# Patient Record
Sex: Female | Born: 1935 | Race: White | Hispanic: No | Smoking: Never smoker
Health system: Southern US, Community
[De-identification: ages and names within clinical notes are randomized; demographics above are authoritative.]

## PROBLEM LIST (undated history)

## (undated) DIAGNOSIS — C801 Malignant (primary) neoplasm, unspecified: Secondary | ICD-10-CM

## (undated) DIAGNOSIS — E039 Hypothyroidism, unspecified: Secondary | ICD-10-CM

## (undated) DIAGNOSIS — I639 Cerebral infarction, unspecified: Secondary | ICD-10-CM

## (undated) DIAGNOSIS — E7800 Pure hypercholesterolemia, unspecified: Secondary | ICD-10-CM

## (undated) DIAGNOSIS — I10 Essential (primary) hypertension: Secondary | ICD-10-CM

## (undated) DIAGNOSIS — K219 Gastro-esophageal reflux disease without esophagitis: Secondary | ICD-10-CM

## (undated) HISTORY — PX: BRAIN SURGERY: SHX531

## (undated) HISTORY — PX: ABDOMINAL HYSTERECTOMY: SHX81

---

## 2006-08-10 ENCOUNTER — Inpatient Hospital Stay: Payer: Self-pay | Admitting: Internal Medicine

## 2006-08-10 ENCOUNTER — Other Ambulatory Visit: Payer: Self-pay

## 2008-07-02 ENCOUNTER — Emergency Department: Payer: Self-pay | Admitting: Emergency Medicine

## 2008-10-26 ENCOUNTER — Emergency Department: Payer: Self-pay | Admitting: Emergency Medicine

## 2011-11-06 ENCOUNTER — Emergency Department: Payer: Self-pay

## 2013-07-18 ENCOUNTER — Emergency Department (HOSPITAL_COMMUNITY)
Admission: EM | Admit: 2013-07-18 | Discharge: 2013-07-18 | Disposition: A | Discharge status: Home / Self Care | Payer: Medicare Other | Attending: Emergency Medicine | Admitting: Emergency Medicine

## 2013-07-18 ENCOUNTER — Emergency Department (HOSPITAL_COMMUNITY): Payer: Medicare Other

## 2013-07-18 ENCOUNTER — Encounter (HOSPITAL_COMMUNITY): Payer: Self-pay

## 2013-07-18 DIAGNOSIS — R5381 Other malaise: Secondary | ICD-10-CM | POA: Insufficient documentation

## 2013-07-18 DIAGNOSIS — Z789 Other specified health status: Secondary | ICD-10-CM

## 2013-07-18 DIAGNOSIS — Z993 Dependence on wheelchair: Secondary | ICD-10-CM | POA: Insufficient documentation

## 2013-07-18 DIAGNOSIS — Z7409 Other reduced mobility: Secondary | ICD-10-CM

## 2013-07-18 DIAGNOSIS — R29898 Other symptoms and signs involving the musculoskeletal system: Secondary | ICD-10-CM | POA: Insufficient documentation

## 2013-07-18 DIAGNOSIS — R29810 Facial weakness: Secondary | ICD-10-CM | POA: Insufficient documentation

## 2013-07-18 DIAGNOSIS — Z86011 Personal history of benign neoplasm of the brain: Secondary | ICD-10-CM | POA: Insufficient documentation

## 2013-07-18 DIAGNOSIS — Z8673 Personal history of transient ischemic attack (TIA), and cerebral infarction without residual deficits: Secondary | ICD-10-CM | POA: Insufficient documentation

## 2013-07-18 HISTORY — DX: Cerebral infarction, unspecified: I63.9

## 2013-07-18 LAB — CBC WITH DIFFERENTIAL/PLATELET
Basophils Absolute: 0 10*3/uL (ref 0.0–0.1)
Basophils Relative: 1 % (ref 0–1)
Eosinophils Absolute: 0.1 10*3/uL (ref 0.0–0.7)
Eosinophils Relative: 1 % (ref 0–5)
HCT: 45.8 % (ref 36.0–46.0)
Hemoglobin: 15.9 g/dL — ABNORMAL HIGH (ref 12.0–15.0)
Lymphocytes Relative: 22 % (ref 12–46)
Lymphs Abs: 1.3 10*3/uL (ref 0.7–4.0)
MCH: 33.6 pg (ref 26.0–34.0)
MCHC: 34.7 g/dL (ref 30.0–36.0)
MCV: 96.8 fL (ref 78.0–100.0)
Monocytes Absolute: 0.6 10*3/uL (ref 0.1–1.0)
Monocytes Relative: 10 % (ref 3–12)
Neutro Abs: 4 10*3/uL (ref 1.7–7.7)
Neutrophils Relative %: 68 % (ref 43–77)
Platelets: 154 10*3/uL (ref 150–400)
RBC: 4.73 MIL/uL (ref 3.87–5.11)
RDW: 14.4 % (ref 11.5–15.5)
WBC: 5.9 10*3/uL (ref 4.0–10.5)

## 2013-07-18 LAB — URINALYSIS, ROUTINE W REFLEX MICROSCOPIC
Bilirubin Urine: NEGATIVE
Glucose, UA: NEGATIVE mg/dL
Ketones, ur: NEGATIVE mg/dL
Leukocytes, UA: NEGATIVE
Nitrite: NEGATIVE
Protein, ur: NEGATIVE mg/dL
Specific Gravity, Urine: 1.01 (ref 1.005–1.030)
Urobilinogen, UA: 0.2 mg/dL (ref 0.0–1.0)
pH: 7 (ref 5.0–8.0)

## 2013-07-18 LAB — COMPREHENSIVE METABOLIC PANEL
ALT: 16 U/L (ref 0–35)
AST: 25 U/L (ref 0–37)
Albumin: 3.9 g/dL (ref 3.5–5.2)
Alkaline Phosphatase: 94 U/L (ref 39–117)
BUN: 12 mg/dL (ref 6–23)
CO2: 31 mEq/L (ref 19–32)
Calcium: 9.6 mg/dL (ref 8.4–10.5)
Chloride: 103 mEq/L (ref 96–112)
Creatinine, Ser: 0.91 mg/dL (ref 0.50–1.10)
GFR calc Af Amer: 66 mL/min — ABNORMAL LOW (ref >90)
GFR calc non Af Amer: 57 mL/min — ABNORMAL LOW (ref >90)
Glucose, Bld: 108 mg/dL — ABNORMAL HIGH (ref 70–99)
Potassium: 3.4 mEq/L — ABNORMAL LOW (ref 3.5–5.1)
Sodium: 144 mEq/L (ref 135–145)
Total Bilirubin: 0.9 mg/dL (ref 0.3–1.2)
Total Protein: 7.3 g/dL (ref 6.0–8.3)

## 2013-07-18 LAB — URINE MICROSCOPIC-ADD ON

## 2013-07-18 LAB — TROPONIN I: Troponin I: <0.3 ng/mL (ref <0.30)

## 2013-07-18 LAB — MAGNESIUM: Magnesium: 2.2 mg/dL (ref 1.5–2.5)

## 2013-07-18 NOTE — ED Notes (Signed)
[facility] stated they will take the pt home and fu with Evlyn Kanner. Social Services [phone] for home health.

## 2013-07-18 NOTE — ED Notes (Signed)
Pt brought here from [place] area because they want to get check out. Also, want help with either placement or help in their home

## 2013-07-18 NOTE — Progress Notes (Signed)
Spoke with pt and spouse prior to MD seeing. They have come to ED due to feeling unable to continue at home without being " checked out" because they are not eating or drinking well and  Think they may be dehydrated, and run down. They are just not doing well trying to care for each other, but do not really want to go into a home, if we at the hospital can "do something for them". Will refer to CSW if  Needed after MD sees pt

## 2013-07-18 NOTE — ED Provider Notes (Signed)
CSN: [id number]     Arrival date & time 07/18/13  1504 History  This chart was scribed for Raeford Razor, MD by Bennett Scrape, ED Scribe. This patient was seen in room APA03/APA03 and the patient's care was started at 4:13 PM.   Chief Complaint  Patient presents with  . multiple complaints     The history is provided by the patient. No language interpreter was used.    HPI Comments: Faith Donovan is a 79 y.o. female who presents to the Emergency Department with husband requesting placement. Husband is the main caretaker of the pt. He states that he can't take of himself, doesn't want to eat and just feels "run down". He states that he doesn't have any energy to ambulate around his house. He states that he does all the cooking and yard work for the pt but just can't do it anymore. Pt states that he has one son that feels that both the pt and her husband should find placement as well. Husband states that she has been seen at [facility] and [facility] with the same complaints and received no help. He reports that he was having a nurse come daily more than 2 years ago but told her to not "come back" due to her saying he was wasting money. Husband denies having any other resources or in home checks since then. Pt has a h/o CVA and brain tumor. 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 Pt states that she uses a wheelchair but can ambulate with a walker.   PCP is Dr. Andres Shad with the [facility] Pt states that he lives at home alone with his wife.  Past Medical History  Diagnosis Date  . Stroke    Past Surgical History  Procedure Laterality Date  . Brain surgery     No family history on file. History  Substance Use Topics  . Smoking status: Not on file  . Smokeless tobacco: Not on file  . Alcohol Use: No   No OB history provided.  Review of Systems  Respiratory: Negative for  shortness of breath.   Cardiovascular: Negative for chest pain.  Gastrointestinal: Negative for abdominal pain.  Neurological: Positive for weakness (chronic right-sided weakness but worsening).  All other systems reviewed and are negative.    Allergies  Review of patient's allergies indicates not on file.  Home Medications  No current outpatient prescriptions on file.  Triage Vitals: BP 141/60  Pulse 70  Temp(Src) 98.3 F (36.8 C) (Oral)  Resp 20  SpO2 94%  Physical Exam  Nursing note and vitals reviewed. Constitutional: She is oriented to person, place, and time. No distress.  frail and chronically ill appearing, but not distressed  HENT:  Head: Normocephalic and atraumatic.  Eyes: EOM are normal.  Adhesions to the right eyelid   Neck: Neck supple. No tracheal deviation present.  Cardiovascular: Normal rate and regular rhythm.   Pulmonary/Chest: Effort normal and breath sounds normal. No respiratory distress.  Abdominal: Soft. There is no tenderness.  Musculoskeletal: Normal range of motion.  Neurological: She is alert and oriented to person, place, and time.  Right sided weakness, contracture of RUE, right-sided facial droop   Skin: Skin is warm and dry.  Psychiatric: She has a normal mood and affect. Her behavior is normal.    ED Course   DIAGNOSTIC STUDIES: Oxygen Saturation is 94% on room air, adequate by  my interpretation.    COORDINATION OF CARE: 4:32 PM-Informed pt that placement may not be achieved today. Discussed treatment plan which includes CBC panel, CMP and UA with pt at bedside and pt agreed to plan. Will have social work speak with the pt.  Procedures (including critical care time)  Labs Reviewed  URINALYSIS, ROUTINE W REFLEX MICROSCOPIC - Abnormal; Notable for the following:    Hgb urine dipstick MODERATE (*)    All other components within normal limits  CBC WITH DIFFERENTIAL - Abnormal; Notable for the following:    Hemoglobin 15.9 (*)     All other components within normal limits  COMPREHENSIVE METABOLIC PANEL - Abnormal; Notable for the following:    Potassium 3.4 (*)    Glucose, Bld 108 (*)    GFR calc non Af Amer 57 (*)    GFR calc Af Amer 66 (*)    All other components within normal limits  MAGNESIUM  TROPONIN I  URINE MICROSCOPIC-ADD ON   Ct Head Wo Contrast  07/18/2013   *RADIOLOGY REPORT*  Clinical Data: Worsening right-sided weakness in patient with prior surgery for removal of unspecified brain tumor.  CT HEAD WITHOUT CONTRAST  Technique:  Contiguous axial images were obtained from the base of the skull through the vertex without contrast.  Comparison: None.  Findings: Prior right temporal-occipital craniectomy with resection of part of the right cerebellum.  Old stroke involving the left basal ganglia with encephalomalacia accounting for enlargement of the frontal horn of the left lateral ventricle.  Moderate cortical atrophy.  Moderate to severe changes of small vessel disease of the white matter diffusely.  No mass lesion.  No midline shift.  No acute hemorrhage or hematoma.  No extra-axial fluid collections. No evidence of acute infarction.  Visualized paranasal sinuses, bilateral mastoid air cells, and left middle ear cavities well-aerated. Prior right mastoidectomy.  IMPRESSION:  1.  No acute intracranial abnormality. 2.  Prior right temporal-occipital craniectomy with resection of a large portion of the right cerebellar hemisphere. 3.  Old stroke involving the left basal ganglia with encephalomalacia.   Original Report Authenticated By: Hulan Saas, M.D.   No results found. 1. Impaired mobility and ADLs     MDM  85yF with generalized fatigue. Pt and husband who also presented are in need of additional assistance. They have a son but it sounds like he has parkinson's and unfortunately unable to provide much assistance. W/u today fairly unremarkable.  Pt afebrile and nontoxic. Pt and husband have no ride home. May  potentially be able to have neighbor provide them a ride. Care management spoke with pt/wife and APS to be involved as well as home nursing assessment arranged.    I personally preformed the services scribed in my presence. The recorded information has been reviewed is accurate. Raeford Razor, MD.    Raeford Razor, MD 07/24/13 [phone]

## 2013-07-18 NOTE — ED Notes (Signed)
Pt very poor historian

## 2013-07-18 NOTE — ED Notes (Signed)
Discharge instructions reviewed with pt, questions answered. Pt verbalized understanding.  

## 2013-11-26 ENCOUNTER — Inpatient Hospital Stay: Payer: Self-pay | Admitting: Internal Medicine

## 2013-11-26 LAB — COMPREHENSIVE METABOLIC PANEL
Albumin: 3.2 g/dL — ABNORMAL LOW (ref 3.4–5.0)
Anion Gap: 9 (ref 7–16)
Bilirubin,Total: 0.9 mg/dL (ref 0.2–1.0)
Calcium, Total: 8.9 mg/dL (ref 8.5–10.1)
Co2: 26 mmol/L (ref 21–32)
Creatinine: 1.13 mg/dL (ref 0.60–1.30)
EGFR (African American): 52 — ABNORMAL LOW
EGFR (Non-African Amer.): 45 — ABNORMAL LOW
Glucose: 164 mg/dL — ABNORMAL HIGH (ref 65–99)
Osmolality: 281 (ref 275–301)
Potassium: 3.8 mmol/L (ref 3.5–5.1)
SGOT(AST): 38 U/L — ABNORMAL HIGH (ref 15–37)
Total Protein: 6.7 g/dL (ref 6.4–8.2)

## 2013-11-26 LAB — CBC WITH DIFFERENTIAL/PLATELET
Basophil %: 1.2 %
Eosinophil #: 0 10*3/uL (ref 0.0–0.7)
HGB: 14.2 g/dL (ref 12.0–16.0)
Lymphocyte %: 10.4 %
MCH: 33.3 pg (ref 26.0–34.0)
Monocyte %: 10.1 %
Neutrophil #: 5.3 10*3/uL (ref 1.4–6.5)
Neutrophil %: 77.6 %
RDW: 15 % — ABNORMAL HIGH (ref 11.5–14.5)
WBC: 6.8 10*3/uL (ref 3.6–11.0)

## 2013-11-26 LAB — URINALYSIS, COMPLETE
Bilirubin,UR: NEGATIVE
Glucose,UR: NEGATIVE mg/dL (ref 0–75)
Hyaline Cast: 7
Leukocyte Esterase: NEGATIVE
Nitrite: NEGATIVE
Ph: 5 (ref 4.5–8.0)
RBC,UR: 5 /HPF (ref 0–5)

## 2013-11-26 LAB — TROPONIN I
Troponin-I: <0.02 ng/mL
Troponin-I: <0.02 ng/mL

## 2013-11-26 LAB — PRO B NATRIURETIC PEPTIDE: B-Type Natriuretic Peptide: 6169 pg/mL — ABNORMAL HIGH (ref 0–450)

## 2013-11-27 LAB — CBC WITH DIFFERENTIAL/PLATELET
BASOS ABS: 0.1 10*3/uL (ref 0.0–0.1)
Basophil %: 1.1 %
EOS ABS: 0.1 10*3/uL (ref 0.0–0.7)
Eosinophil %: 1.3 %
HCT: 40.1 % (ref 35.0–47.0)
HGB: 13.6 g/dL (ref 12.0–16.0)
LYMPHS ABS: 0.8 10*3/uL — AB (ref 1.0–3.6)
LYMPHS PCT: 15.4 %
MCH: 33.8 pg (ref 26.0–34.0)
MCHC: 33.9 g/dL (ref 32.0–36.0)
MCV: 100 fL (ref 80–100)
MONO ABS: 0.4 x10 3/mm (ref 0.2–0.9)
Monocyte %: 8.7 %
Neutrophil #: 3.8 10*3/uL (ref 1.4–6.5)
Neutrophil %: 73.5 %
Platelet: 173 10*3/uL (ref 150–440)
RBC: 4.02 10*6/uL (ref 3.80–5.20)
RDW: 15.2 % — AB (ref 11.5–14.5)
WBC: 5.2 10*3/uL (ref 3.6–11.0)

## 2013-11-27 LAB — BASIC METABOLIC PANEL
ANION GAP: 2 — AB (ref 7–16)
BUN: 11 mg/dL (ref 7–18)
CALCIUM: 8.7 mg/dL (ref 8.5–10.1)
CHLORIDE: 102 mmol/L (ref 98–107)
CO2: 35 mmol/L — AB (ref 21–32)
CREATININE: 1.07 mg/dL (ref 0.60–1.30)
EGFR (Non-African Amer.): 48 — ABNORMAL LOW
GFR CALC AF AMER: 56 — AB
Glucose: 132 mg/dL — ABNORMAL HIGH (ref 65–99)
Osmolality: 279 (ref 275–301)
Potassium: 3.1 mmol/L — ABNORMAL LOW (ref 3.5–5.1)
Sodium: 139 mmol/L (ref 136–145)

## 2013-11-28 LAB — BASIC METABOLIC PANEL
ANION GAP: 5 — AB (ref 7–16)
BUN: 13 mg/dL (ref 7–18)
CHLORIDE: 103 mmol/L (ref 98–107)
Calcium, Total: 8.4 mg/dL — ABNORMAL LOW (ref 8.5–10.1)
Co2: 34 mmol/L — ABNORMAL HIGH (ref 21–32)
Creatinine: 1.03 mg/dL (ref 0.60–1.30)
EGFR (Non-African Amer.): 50 — ABNORMAL LOW
GFR CALC AF AMER: 58 — AB
GLUCOSE: 75 mg/dL (ref 65–99)
OSMOLALITY: 282 (ref 275–301)
POTASSIUM: 3.2 mmol/L — AB (ref 3.5–5.1)
Sodium: 142 mmol/L (ref 136–145)

## 2013-12-17 ENCOUNTER — Ambulatory Visit: Payer: Self-pay | Admitting: Family Medicine

## 2014-01-25 ENCOUNTER — Ambulatory Visit: Payer: Self-pay | Admitting: Internal Medicine

## 2014-01-27 LAB — CBC
HCT: 43.9 % (ref 35.0–47.0)
HGB: 14.3 g/dL (ref 12.0–16.0)
MCH: 32.5 pg (ref 26.0–34.0)
MCHC: 32.6 g/dL (ref 32.0–36.0)
MCV: 100 fL (ref 80–100)
PLATELETS: 162 10*3/uL (ref 150–440)
RBC: 4.4 10*6/uL (ref 3.80–5.20)
RDW: 17.7 % — ABNORMAL HIGH (ref 11.5–14.5)
WBC: 5.6 10*3/uL (ref 3.6–11.0)

## 2014-01-27 LAB — COMPREHENSIVE METABOLIC PANEL
ALBUMIN: 3.4 g/dL (ref 3.4–5.0)
ALK PHOS: 127 U/L — AB
ALT: 21 U/L (ref 12–78)
Anion Gap: 8 (ref 7–16)
BILIRUBIN TOTAL: 1.2 mg/dL — AB (ref 0.2–1.0)
BUN: 20 mg/dL — AB (ref 7–18)
CHLORIDE: 99 mmol/L (ref 98–107)
Calcium, Total: 9.2 mg/dL (ref 8.5–10.1)
Co2: 32 mmol/L (ref 21–32)
Creatinine: 1.35 mg/dL — ABNORMAL HIGH (ref 0.60–1.30)
GFR CALC AF AMER: 42 — AB
GFR CALC NON AF AMER: 36 — AB
Glucose: 97 mg/dL (ref 65–99)
OSMOLALITY: 280 (ref 275–301)
Potassium: 3.5 mmol/L (ref 3.5–5.1)
SGOT(AST): 37 U/L (ref 15–37)
SODIUM: 139 mmol/L (ref 136–145)
Total Protein: 7.3 g/dL (ref 6.4–8.2)

## 2014-01-27 LAB — PRO B NATRIURETIC PEPTIDE: B-TYPE NATIURETIC PEPTID: 8220 pg/mL — AB (ref 0–450)

## 2014-01-27 LAB — TROPONIN I

## 2014-01-28 ENCOUNTER — Inpatient Hospital Stay: Payer: Self-pay | Admitting: Family Medicine

## 2014-01-28 LAB — COMPREHENSIVE METABOLIC PANEL
ALBUMIN: 2.7 g/dL — AB (ref 3.4–5.0)
ALK PHOS: 99 U/L
ANION GAP: 6 — AB (ref 7–16)
BILIRUBIN TOTAL: 1 mg/dL (ref 0.2–1.0)
BUN: 19 mg/dL — AB (ref 7–18)
Calcium, Total: 8.3 mg/dL — ABNORMAL LOW (ref 8.5–10.1)
Chloride: 100 mmol/L (ref 98–107)
Co2: 34 mmol/L — ABNORMAL HIGH (ref 21–32)
Creatinine: 1.21 mg/dL (ref 0.60–1.30)
EGFR (African American): 48 — ABNORMAL LOW
EGFR (Non-African Amer.): 41 — ABNORMAL LOW
GLUCOSE: 66 mg/dL (ref 65–99)
OSMOLALITY: 280 (ref 275–301)
Potassium: 3 mmol/L — ABNORMAL LOW (ref 3.5–5.1)
SGOT(AST): 28 U/L (ref 15–37)
SGPT (ALT): 12 U/L (ref 12–78)
SODIUM: 140 mmol/L (ref 136–145)
Total Protein: 5.7 g/dL — ABNORMAL LOW (ref 6.4–8.2)

## 2014-01-28 LAB — CBC WITH DIFFERENTIAL/PLATELET
Basophil #: 0 10*3/uL (ref 0.0–0.1)
Basophil %: 1.4 %
Eosinophil #: 0 10*3/uL (ref 0.0–0.7)
Eosinophil %: 1.5 %
HCT: 37.4 % (ref 35.0–47.0)
HGB: 12.7 g/dL (ref 12.0–16.0)
LYMPHS ABS: 0.8 10*3/uL — AB (ref 1.0–3.6)
LYMPHS PCT: 24.2 %
MCH: 33.9 pg (ref 26.0–34.0)
MCHC: 33.9 g/dL (ref 32.0–36.0)
MCV: 100 fL (ref 80–100)
MONO ABS: 0.4 x10 3/mm (ref 0.2–0.9)
MONOS PCT: 12.9 %
Neutrophil #: 2 10*3/uL (ref 1.4–6.5)
Neutrophil %: 60 %
Platelet: 116 10*3/uL — ABNORMAL LOW (ref 150–440)
RBC: 3.73 10*6/uL — AB (ref 3.80–5.20)
RDW: 17.6 % — AB (ref 11.5–14.5)
WBC: 3.3 10*3/uL — ABNORMAL LOW (ref 3.6–11.0)

## 2014-01-28 LAB — PRO B NATRIURETIC PEPTIDE: B-Type Natriuretic Peptide: 8814 pg/mL — ABNORMAL HIGH (ref 0–450)

## 2014-01-29 LAB — BASIC METABOLIC PANEL
ANION GAP: 5 — AB (ref 7–16)
BUN: 21 mg/dL — ABNORMAL HIGH (ref 7–18)
CALCIUM: 8.5 mg/dL (ref 8.5–10.1)
CO2: 34 mmol/L — AB (ref 21–32)
CREATININE: 1.34 mg/dL — AB (ref 0.60–1.30)
Chloride: 103 mmol/L (ref 98–107)
EGFR (African American): 42 — ABNORMAL LOW
EGFR (Non-African Amer.): 37 — ABNORMAL LOW
GLUCOSE: 88 mg/dL (ref 65–99)
OSMOLALITY: 286 (ref 275–301)
Potassium: 3.4 mmol/L — ABNORMAL LOW (ref 3.5–5.1)
Sodium: 142 mmol/L (ref 136–145)

## 2014-01-29 LAB — PROTIME-INR
INR: 1.4
Prothrombin Time: 16.8 secs — ABNORMAL HIGH (ref 11.5–14.7)

## 2014-01-29 LAB — PROTEIN, BODY FLUID: PROTEIN, BODY FLUID: 2 g/dL

## 2014-01-29 LAB — LACTATE DEHYDROGENASE, PLEURAL OR PERITONEAL FLUID: LDH, Body Fluid: 74 U/L

## 2014-01-29 LAB — GLUCOSE, SEROUS FLUID: GLUCOSE, BODY FLUID: 98 mg/dL

## 2014-02-25 ENCOUNTER — Ambulatory Visit: Payer: Self-pay | Admitting: Internal Medicine

## 2014-03-09 LAB — COMPREHENSIVE METABOLIC PANEL
ALBUMIN: 3.2 g/dL — AB (ref 3.4–5.0)
ALK PHOS: 133 U/L — AB
ANION GAP: 8 (ref 7–16)
AST: 26 U/L (ref 15–37)
BILIRUBIN TOTAL: 1.3 mg/dL — AB (ref 0.2–1.0)
BUN: 16 mg/dL (ref 7–18)
Calcium, Total: 9.4 mg/dL (ref 8.5–10.1)
Chloride: 98 mmol/L (ref 98–107)
Co2: 33 mmol/L — ABNORMAL HIGH (ref 21–32)
Creatinine: 1 mg/dL (ref 0.60–1.30)
EGFR (Non-African Amer.): 52 — ABNORMAL LOW
Glucose: 84 mg/dL (ref 65–99)
Osmolality: 278 (ref 275–301)
Potassium: 3.7 mmol/L (ref 3.5–5.1)
SGPT (ALT): 22 U/L (ref 12–78)
SODIUM: 139 mmol/L (ref 136–145)
Total Protein: 7.4 g/dL (ref 6.4–8.2)

## 2014-03-09 LAB — URINALYSIS, COMPLETE
BILIRUBIN, UR: NEGATIVE
Glucose,UR: NEGATIVE mg/dL (ref 0–75)
LEUKOCYTE ESTERASE: NEGATIVE
Nitrite: NEGATIVE
Ph: 6 (ref 4.5–8.0)
Protein: 30
SPECIFIC GRAVITY: 1.015 (ref 1.003–1.030)

## 2014-03-09 LAB — TROPONIN I: Troponin-I: <0.02 ng/mL

## 2014-03-09 LAB — CBC
HCT: 43.4 % (ref 35.0–47.0)
HGB: 14.7 g/dL (ref 12.0–16.0)
MCH: 33.7 pg (ref 26.0–34.0)
MCHC: 33.9 g/dL (ref 32.0–36.0)
MCV: 100 fL (ref 80–100)
PLATELETS: 196 10*3/uL (ref 150–440)
RBC: 4.37 10*6/uL (ref 3.80–5.20)
RDW: 16.5 % — ABNORMAL HIGH (ref 11.5–14.5)
WBC: 6.2 10*3/uL (ref 3.6–11.0)

## 2014-03-10 ENCOUNTER — Observation Stay: Payer: Self-pay | Admitting: Internal Medicine

## 2014-03-10 LAB — CBC WITH DIFFERENTIAL/PLATELET
BASOS PCT: 0.9 %
Basophil #: 0.1 10*3/uL (ref 0.0–0.1)
EOS ABS: 0.1 10*3/uL (ref 0.0–0.7)
Eosinophil %: 1.6 %
HCT: 39.5 % (ref 35.0–47.0)
HGB: 12.8 g/dL (ref 12.0–16.0)
LYMPHS ABS: 0.6 10*3/uL — AB (ref 1.0–3.6)
LYMPHS PCT: 10.5 %
MCH: 32.6 pg (ref 26.0–34.0)
MCHC: 32.5 g/dL (ref 32.0–36.0)
MCV: 101 fL — ABNORMAL HIGH (ref 80–100)
MONO ABS: 0.5 x10 3/mm (ref 0.2–0.9)
Monocyte %: 7.9 %
NEUTROS ABS: 4.6 10*3/uL (ref 1.4–6.5)
Neutrophil %: 79.1 %
Platelet: 163 10*3/uL (ref 150–440)
RBC: 3.93 10*6/uL (ref 3.80–5.20)
RDW: 16.5 % — ABNORMAL HIGH (ref 11.5–14.5)
WBC: 5.8 10*3/uL (ref 3.6–11.0)

## 2014-03-10 LAB — BASIC METABOLIC PANEL
ANION GAP: 8 (ref 7–16)
BUN: 12 mg/dL (ref 7–18)
CHLORIDE: 102 mmol/L (ref 98–107)
CREATININE: 0.9 mg/dL (ref 0.60–1.30)
Calcium, Total: 8.9 mg/dL (ref 8.5–10.1)
Co2: 32 mmol/L (ref 21–32)
EGFR (African American): >60
EGFR (Non-African Amer.): 59 — ABNORMAL LOW
GLUCOSE: 61 mg/dL — AB (ref 65–99)
Osmolality: 281 (ref 275–301)
POTASSIUM: 3.8 mmol/L (ref 3.5–5.1)
SODIUM: 142 mmol/L (ref 136–145)

## 2014-03-11 LAB — HEPATIC FUNCTION PANEL A (ARMC)
Albumin: 2.9 g/dL — ABNORMAL LOW (ref 3.4–5.0)
Alkaline Phosphatase: 119 U/L — ABNORMAL HIGH
Bilirubin, Direct: 0.3 mg/dL — ABNORMAL HIGH (ref 0.00–0.20)
Bilirubin,Total: 1.1 mg/dL — ABNORMAL HIGH (ref 0.2–1.0)
SGOT(AST): 35 U/L (ref 15–37)
SGPT (ALT): 19 U/L (ref 12–78)
Total Protein: 6.6 g/dL (ref 6.4–8.2)

## 2014-03-27 ENCOUNTER — Ambulatory Visit: Payer: Self-pay | Admitting: Internal Medicine

## 2015-01-06 ENCOUNTER — Emergency Department: Payer: Self-pay | Admitting: Emergency Medicine

## 2015-03-20 NOTE — Consult Note (Signed)
PATIENT NAME:  Faith Donovan, Faith Donovan MR#:  [id number] DATE OF BIRTH:  03/28/1928  DATE OF CONSULTATION:  01/30/2014  CONSULTING PHYSICIAN:  Joaquina Nissen E. Genevive Bi, MD  REQUESTING PHYSICIAN:  [place] Sink, MD  REASON FOR CONSULT: Management of right pleural effusion.  I have personally seen and examined Faith Donovan. I have discussed her care with Ms. Ebbie Ridge from the hospice center.   REPORT OF CONSULTATION:  Faith Donovan is an 81 year old woman who suffered a stroke many years ago, and is able to walk with great difficulty using a walker. She has noticed over the last several days increasing swelling in her right upper extremity. She presented to the Emergency Room, where she was found to have a significant right-sided pleural effusion, which underwent a thoracentesis. Post-thoracentesis, her chest x-ray was markedly improved. However, over the course of just 24 hours, the pleural effusion has recurred. She does have a history of diastolic heart failure, with an ejection fraction of 60% to 65%, but when she came into the hospital this admission, her BNP was markedly elevated and her chest x-ray showed bilateral pleural effusions consistent with heart failure.   On physical examination, she is inspiring nasal cannula oxygen. She does not appear to be in any distress. She does not complain of any shortness of breath. She has an irregularly irregular heart rate. She has marked jugular venous distention on the right and some on the left. There is marked swelling of her right upper extremity. There is not a lot of swelling in her lower extremities.   I believe that there is some vascular obstruction on the right side, which is causing her right upper extremity swelling, and perhaps even in the right neck. It seems somewhat unusual to have the lower extremity and the left upper extremity spared of any swelling if this were just due to heart failure alone. She does have diminished breath  sounds on the right side, but the left side is actually reasonably clear.   I have discussed her care with Ms. Ebbie Ridge from the hospice center. I have also read Dr. Marcene Corning notes. There does not appear to be a great desire for extensive investigation or therapy. The patient does not want any surgical interventions. I specifically asked her this, and she does not want anything further to be done from a surgery standpoint.   At the present time, I have little to offer her. It does appear that her pleural effusions are related to heart failure as the most likely cause. I would recommend, if anything further is contemplated, that an additional thoracentesis be performed. At that time, I would leave a pigtail catheter in place so that we could entertain the possibility of performing a doxycycline pleurodesis. However, if the patient does not wish anything further to be done, I believe that hospice would be a reasonable choice for her.   Thank you very much for allowing me to participate in her care.    ____________________________ Lew Dawes. Genevive Bi, MD teo:mr D: 01/30/2014 17:37:43 ET T: 01/30/2014 23:06:18 ET JOB#: [id number]  cc: Christia Reading E. Genevive Bi, MD, <Dictator> Louis Matte MD ELECTRONICALLY SIGNED 02/09/2014 8:23

## 2015-03-20 NOTE — H&P (Signed)
PATIENT NAME:  Faith Donovan, HUGHSTON MR#:  [id number] DATE OF BIRTH:  Aug 23, 1928  DATE OF ADMISSION:  11/26/2013  PRIMARY CARE PHYSICIAN: Dr. Delight Stare.    REFERRING EMERGENCY ROOM PHYSICIAN: Dr. Lavonia Drafts, M.D.   CHIEF COMPLAINT: Leg swelling and shortness of breath.  HISTORY OF PRESENT ILLNESS: An 81 year old female who has a history of hypertension, hypercholesterolemia, old stroke, some leftover weakness on right side of her body, but living independent life with her husband walking with a walker. For the last few days, she noticed that her legs are swollen more and she is feeling short of breath especially while walking or while lying down and so decided to come to the Emergency Room for this problem. On workup she was found having an elevated BNP and some moderate pleural effusion on the right side of the chest with edema on the legs. Normal troponin but atrial fibrillation on EKG. She never had history of EKG or did not find any previous EKGs  showing atrial fibrillation, so we are admitting her for further work-up of CHF and new onset of AFib.  On further questioning, she denies any complaint of fever, cough or chest pain or palpitation.   REVIEW OF SYSTEMS: CONSTITUTIONAL: Negative for fever, fatigue, weakness or pain or weight loss.  EYES: No blurring, double vision, discharge or redness.  EARS, NOSE, THROAT: No tinnitus, ear pain or hearing loss.  RESPIRATORY: No cough, wheezing, hemoptysis. Positive for shortness of breath.  CARDIOVASCULAR: No chest pain, orthopnea, edema, arrhythmia or palpitations.  GASTROINTESTINAL: No nausea, vomiting, diarrhea or abdominal pain.  GENITOURINARY: No dysuria, hematuria or increased frequency.  ENDOCRINE: No increased sweating. No heat or cold intolerance.  SKIN: No acne, rashes, or lesions.  MUSCULOSKELETAL: No pain or swelling in the joints but her legs are swollen.  NEUROLOGICAL: No numbness, weakness, tremor or vertigo, but chronic right-sided  weakness secondary to her stroke in 1986.  PSYCHIATRIC: No anxiety, insomnia, bipolar disorder.   PAST MEDICAL HISTORY: 1.  Stroke in 1986 with residual right-sided facial and right-sided body weakness with some chronic contractures.  2.  Hypertension.  3.  Hypothyroidism.   PAST SURGICAL HISTORY: None.   SOCIAL HISTORY: Lives with her husband, walks with a walker. She was a smoker years ago but quit when she had stroke in 1986. Denies drinking alcohol or doing any drugs.   FAMILY HISTORY: Positive for stroke in her father and cancer in her mother and some other family members.    HOME MEDICATIONS:   1.  Quinapril 10 mg 2 times a day.  2.  Nifedipine 60 mg oral once a day.  3.  Levothyroxine 50 mcg once a day.  4.  Gabapentin 100 mg once a day.  5.  Furosemide 20 mg every other day.  6.  Atenolol 50 mg 2 tablets once a day.   PHYSICAL EXAMINATION:   VITAL SIGNS:  In the ER, temperature 97.7, pulse rate ranging from 100 to 120, respirations 18, blood pressure 116/68 and pulse oximetry 88% on room air, which came up to 95% with 2 liters nasal cannula oxygen supplementation.  GENERAL: The patient is fully alert and oriented to time, place, and person, cooperative with history taking and physical examination.  HEAD AND NECK:  Atraumatic. Conjunctiva pink. Oral mucosa moist. There is some facial weakness on the right side of the face present and the right side eyelid is drooling with some secretions and dryness in right eye.    NECK: Supple. JVD  is present up to angle of mandible.  CARDIOVASCULAR: S1, S2 present, irregular murmur present.  RESPIRATORY: Bilateral air entry present, slightly decreased in the right. Some crepitation present.  ABDOMEN: Soft, nontender. Bowel sounds present. No organomegaly.  SKIN: No rashes.  LEGS: Edema present, more on right side compared to left.   JOINTS:  There are chronic atrophic changes present on right side upper limb and right lower limb.   NEUROLOGICAL: Right-sided weakness power 4/5, left side 5/5. No tremors or vertigo.  PSYCHIATRIC: Does not appear in any acute psychiatric illness at this time.   IMPORTANT LABORATORY RESULTS: BNP 6169, glucose 164, BUN 13, creatinine 1.13, sodium 139, potassium 3.8, chloride 104, CO2 26, magnesium 2.1, calcium 8.9.   Total protein 6.7, albumin 3.2, bilirubin 0.9, alkaline phosphate 110, SGOT 38 and SGPT 23. Troponin less than 0.02.   WBC 6.8, hemoglobin 14.2, platelet count 202, and MCV is 101.   Urinalysis is grossly negative.   Chest x-ray, portable, shows moderate right pleural effusion with dense basilar or consolidation. EKG shows atrial fibrillation.   ASSESSMENT AND PLAN: An 81 year old female with past medical history of cerebrovascular accident and hypertension, came to the hospital with complaint of feeling leg swelling and short of breath for a few days, found having congestive heart failure and new onset atrial fibrillation as we do not have any record of atrial fibrillation in the past. 2.  Congestive heart failure exacerbation. Most likely it is acute diastolic but we do not have any ejection fraction recording so we will do echocardiogram here. Meanwhile, we will give IV Lasix, monitor on telemetry and check serial troponin. We will also get Cardiology consult because of these new onset issues.  3.  New-onset atrial fibrillation. We will follow troponin. Will give Cardizem oral for rate control and carvedilol for blood pressure and rate management and monitor on telemetry.  4.  Hypertension. Atenolol changed to carvedilol because of atrial fibrillation.  We will give Lasix and ACE inhibitor.  5.  Old stroke. We will give aspirin and will check lipid panel.   TOTAL TIME SPENT ON THIS ADMISSION: 50 minutes.   CODE STATUS: FULL CODE    ____________________________ Ceasar Lund Anselm Jungling, MD vgv:dp D: 11/26/2013 16:15:34 ET T: 11/26/2013 17:08:56  ET JOB#: [id number]  cc: Ceasar Lund. Anselm Jungling, MD, <Dictator> Vaughan Basta MD ELECTRONICALLY SIGNED 12/03/2013 13:51

## 2015-03-20 NOTE — Consult Note (Signed)
Present Illness 81 yo female with history of right body cva which is not acute who was admitted with progressive shortness of breath while ambulating with her husband and noted to have increased peripheral edema. She was brought to the er and noted to have afib. Pt has no evidence of afib on ekgs here at [facility] but pt states she was admitted to a hospital somewhere (pt is not sure which one) and she was told her heart was "cutting up but nothing could be done".  SHe has been aware of irregular heart beat for several years. Her CHADSS score is 5.  She denies falls or head trauma. Her cva is chronic.   Physical Exam:  GEN disheveled   HEENT moist oral mucosa   NECK supple   RESP normal resp effort  clear BS   CARD Irregular rate and rhythm  Murmur   Murmur Systolic   Systolic Murmur axilla   ABD denies tenderness  normal BS   LYMPH negative neck   EXTR positive edema   SKIN normal to palpation   NEURO R side weakness   PSYCH alert   Review of Systems:  Subjective/Chief Complaint shortness of breath and irregiular heart rate   General: Fatigue   Skin: No Complaints   ENT: No Complaints   Eyes: No Complaints   Neck: No Complaints   Respiratory: Short of breath   Cardiovascular: Palpitations  Dyspnea   Gastrointestinal: No Complaints   Genitourinary: No Complaints   Vascular: No Complaints   Musculoskeletal: No Complaints   Neurologic: No Complaints   Hematologic: No Complaints   Endocrine: No Complaints   Psychiatric: No Complaints   Review of Systems: All other systems were reviewed and found to be negative   Medications/Allergies Reviewed Medications/Allergies reviewed   EKG:  Abnormal NSSTTW changes   Interpretation afib with variable venticular resposne    Other -Explain in Comment: Other   Impression 81 yo female with history of right body cva which is not acute who was admitted with progressive shortness of breath while ambulating with  her husband and noted to have increased peripheral edema. She was brought to the er and noted to have afib. Pt has no evidence of afib on ekgs here at [facility] but pt states she was admitted to a hospital somewhere (pt is not sure which one) and she was told her heart was "cutting up but nothing could be done".  SHe has been aware of irregular heart beat for several years. Her CHADSS score is 5.  She denies falls or head trauma. Her cva is chronic. Would recommend warfarin or xarelto at discharge if no contraindication neurologically. Echo reveals preserved lv funciton but has increased right sided pressures and tr suggesting pulmonary hypertension which is likely at least the cause of part of her peripheral edema. This may also explain her right pleural effusion. There was no obvious pulmonary edema noted on cxr. Will need to rule out pulmonary emboli as well. This would be  another indication for anticoagulation..   Plan 1. Continue with rate control of her afib. Not a candidate for attempt at cardioversion given relative hemodynamic stability , lack of anti coagulaiton currently and difficulty in maintaining sr given tr and pulmonary hyeprtenison with right atrial enlargement.  2. Discuss risk of chronic anticoagulaiton with neurology given apparent history of surgical intervention of the brain 3. Careful diuresis 4. Will follow with you further recs pending course.   Electronic Signatures: Teodoro Spray (MD)  (  Signed 01-Jan-15 17:21)  Authored: General Aspect/Present Illness, History and Physical Exam, Review of System, EKG , Allergies, Impression/Plan   Last Updated: 01-Jan-15 17:21 by Teodoro Spray (MD)

## 2015-03-20 NOTE — H&P (Signed)
PATIENT NAME:  Faith Donovan, MENDOSA MR#:  [id number] DATE OF BIRTH:  1928-01-19  DATE OF ADMISSION:  03/09/2014  PRIMARY CARE PHYSICIAN:  Marguerita Merles, MD.  EMERGENCY ROOM PHYSICIAN:  Latina Craver, MD.  CHIEF COMPLAINT: Worsening dysphagia.   HISTORY OF PRESENT ILLNESS: The patient is an 81 year old female patient discharged on 03/09 with home hospice, comes in because of worsening dysphagia. Since Friday, patient unable to swallow anything and keeps choking sensation. The patient also feels worsening of her slowed speech and also worsening of her blurred vision. The patient has old CVAs and followed with hospice because of her CVAs and CHF, comes in because of the same troubles that I have dictated. The patient denies any cough or fever but had some abdominal pain on Friday and last bowel movement was Friday. The patient's weakness in hands and legs are same like before, but her main concern is difficulty swallowing and choking sensation when she tries to swallow since Friday and she did not take her medications since Friday.   PAST MEDICAL HISTORY: Significant for history of pleural effusions and the patient has recurrent pleural effusion and she did have a thoracocentesis with drainage of the fluid ( before. and thoracocentesis on home oxygen 3 liters, followed with hospice. The patient has history of acute history of diastolic heart failure, hypertension and hypothyroidism and neuropathy.   Again, she was discharged on 03/09. She was discharged on mechanical soft diet with liquids and ground meats, (with aspiration precautions. Since Friday, she is not even able to swallow that.  OTHER MEDICATIONS: Include levothyroxine 50 mcg p.o. daily; Neurontin 100 mg at bedtime; atenolol 50 mg 2 tablets in the morning, 1 tablet in the evening; )  nifedipine ER 60 mg p.o. daily, Roxanol 0.25 mL 20 mg/mL and she takes 0.25 every 2 hours as needed for trouble breathing.  She is on Ensure and Xarelto was  stopped last time.   ALLERGIES:  NONE.   SOCIAL HISTORY: Lives with the husband and had a walker. Followed with hospice. Used to smoke, now quit in 1986.   SURGICAL HISTORY: History of surgery for right eye tumor.   FAMILY HISTORY: Father had a stroke. Mother had cancer.   REVIEW OF SYSTEMS: CONSTITUTIONAL: Has no fever or fatigue.  EYES: Patient had a right eye enucleation and denies any other problem except worsening of blurred vision.  EARS, NOSE, AND THROAT:  No tinnitus. The patient has no epistaxis. Has difficulty swallowing since Friday and also has right facial droop.  RESPIRATIONS: The patient has no cough. No wheezing and does have trouble breathing.  CARDIOVASCULAR: The patient has no edema at this time. No chest pain. No orthopnea.  GASTROINTESTINAL: Had abdominal pain on Friday but denies any abdominal pain now and last bowel movement was Friday. No nausea, no vomiting but able to get the anything down  because of difficulty swallowing.  HEMATOLOGIC: No anemia or easy bruising.  ENDOCRINE: The patient has no polyuria or nocturia.  NEUROLOGIC: Had strokes before.  PSYCHIATRIC: No anxiety or insomnia.   PHYSICAL EXAMINATION:  VITAL SIGNS: Temperature 97.8, heart rate 102, blood pressure 122/75, saturations 96%-97% on 2 liters. Patient's heart rate varied within 102-112 during my visit.  The patient was in atrial fibrillation.  She is alert, awake, oriented, elderly female, not in distress. Has facial droop to the right side and has slurred speech and has expressive aphasia.  HEENT: Her pupils are unable to be compared because of tumor in  the right eye which was removed.  Patient has significant ptosis of the right eye. Left, the pupil is equal, reacting to light. Extraocular movements are intact on the left side. Patient's tongue is deviated to the right side and conjunctivae are pink.  No oral lesions.  In the nose, no turbinate hypertrophy. Ears: No tympanic  membrane  congestion.NECK: Supple. No JVD. No lymphadenopathy. No carotid bruit.  CARDIOVASCULAR: The patient is in atrial fibrillation and the rate is around 112 beats and rhythm is regular.  LUNGS: Decreased air entry bilaterally in the bases, but otherwise clear. No wheezing and not using accessory muscles of respiration.  ABDOMEN: Soft, nontender, nondistended. Bowel sounds present.  EXTREMITIES: No cyanosis, no clubbing, no edema.  NEUROLOGIC: The patient has right facial droop and slightly weak on right upper extremities and right lower extremity. The left side power is better at 4/5 and patient has some finger contractures on the right hand.  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   Sensations are intact on both sides.  PSYCHIATRIC: Alert and oriented and cooperative.  SKIN: No skin rashes.  LYMPHATICS: No lymphadenopathy in cervical or axillary region.   RADIOLOGICAL AND LABORATORY DATA:  stable appearance of right mastoidectomy. Other Visualized paranasal sinuses and mastoids are clear. No acute or suspicious osseous lesion in the skull. No acute orbit or scalp soft tissue findings.  Chronic right cerebellar infarct. Chronic left MCA white matter infarct also affecting the left basal ganglia. Stable cerebral volume. Stable ventricle size and configuration. No midline shift, mass effect, or evidence of intracranial mass lesion. No evidence of cortically based acute infarction identified. Stable intracranial vascular density.  . CT neck with contrast shows distal vertebral artery stenosis at C1-C2 levels on the left side.  No CTA changes in acute posterior fossa are identified.  (i.chest xray . Large right and moderate left leg pleural effusion.  WBC 6.2, hemoglobin 14.7, hematocrit 43.4, platelets 196,000. Electrolytes: Sodium is 139, potassium 3.7, chloride 98, bicarbonate 33, BUN 16, creatinine 1, glucose 84. Troponin 0.02. Urinalysis  is showing bacteria 1, WBC 0.5.  EKG: Atrial fibrillation with 106 beats per minute.   ASSESSMENT AND PLAN:  1. An 81 year old female with worsening dysphagia and worsening expressive aphasia. Her symptoms are concerning for a posterior fossa stroke because of CT neck showing a possible vertebral artery stenosis and her symptoms.  ER doctor has discussed with the neurologist on call, recommended to keep the patient and get an MRI of the brain. The patient also wants to stay in because of inability to eat since Friday. The patient will be admitted to the hospitalist service IV and we will keep her n.p.o. Give her IV fluids, get speech therapy evaluation, and get an MRI of the brain and also obtain neurology consult.  2. Chronic atrial fibrillation with rapid ventricular response. The patient is n.p.o. Use beta blockers. Hold all the p.o. medications with amlodipine and atenolol and other medication.  3. History of diastolic heart failure and pleural effusion. The patient did have thoracocentesis with no relief. But at this time, patient is not hypoxic and  her 02  saturations around 99% so monitor closely. Because she is n.p.o., we are not able to give her Lasix, and see how she does. Continue gentle hydration because she is n.p.o. and has severe dysphagia.   CODE STATUS:  DO NOT RESUSCITATE. Following with hospice.  She wants to stay  this time and will get speech therapy evaluation.   The patient is admitted to telemetry because of atrial fibrillation with RVR and acute stroke.  TIME SPENT: 60 minutes.   ____________________________ Epifanio Lesches, MD sk:dd D: 03/09/2014 18:14:00 ET T: 03/09/2014 19:16:37 ET JOB#: [id number]  cc: Epifanio Lesches, MD, <Dictator> Epifanio Lesches MD ELECTRONICALLY SIGNED 04/16/2014 10:22

## 2015-03-20 NOTE — Discharge Summary (Signed)
PATIENT NAME:  Faith Donovan, Faith Donovan MR#:  [id number] DATE OF BIRTH:  [birth year]  DATE OF ADMISSION:  03/09/2014 DATE OF DISCHARGE:  03/11/2014  ADMITTING DIAGNOSIS: Dysphagia.   DISCHARGE DIAGNOSES: 1.  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 No new stroke.  2.  History of stroke with neuropathy.  3.  Hypothyroidism.  4.  Atrial fibrillation. 5.  Hypertension.  6.  Diastolic congestive heart failure.  7.  Pleural effusions.  8.  Chronic respiratory failure, on 3 liters of oxygen through nasal cannula at home.   DISCHARGE CONDITION: Stable.   DISCHARGE MEDICATIONS:  1.  The patient is to continue levothyroxine 50 mcg p.o. daily.  2.  Gabapentin 100 mg p.o. at bedtime.  3.  Atenolol 50 mg 2 tablets in the morning and 1 tablet at bedtime.  4.  Klor-Con 10 mEq once daily.  5.  Nifedipine 60 mg p.o. daily.  6.  Motrin 20 mg in 1 mL oral concentrate 0.25 mL every 2 hours as needed.  7.  Furosemide 20 mg p.o. 2 tablets once daily.  8.  Senna Plus 50/8.6 mg 1 to 2 tablets twice daily as needed.  9.  Lovastatin 40 mg p.o. daily.  10.  Quinapril 10 mg p.o. daily.  11.  Protonix 40 mg once daily.   HOME OXYGEN: With portable tank at 3 liters of oxygen through nasal cannula.   DIET: Two grams salt, low-fat, low-cholesterol, pureed, thin liquids.   ACTIVITY LIMITATIONS: As tolerated.   REFERRAL: To hospice at home.   FOLLOWUP APPOINTMENT: With Dr. Delight Stare in 2 days after discharge.   CONSULTANTS:  Speech therapy, care management, social work, Dr. Valora Corporal, neurologist, palliative care.   RADIOLOGIC STUDIES: CT scan of the head without contrast, 13th of April 2015, showing stable chronic ischemic disease, no acute intracranial abnormality. CT of neck with contrast, 13th of April 2015, revealing distal left vertebral artery occlusion at the level of C1 to C2, new since 2012, no CT changes of acute posterior fossa infarct was identified but brain MRI would  be sensitive according to radiologist. Otherwise stable neck soft tissues since 2012, large right and moderate left layering pleural effusions. KUB 13th of April 2015, showed nonobstructive bowel gas pattern, no pneumoperitoneum. 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 Barium swallowing studies 15th of April 2015, showing limited study but no findings to suggest a high grade stricture. There are marked changes of presbyesophagus, the 12 mm barium pill did pass without significant delay despite the fact that the patient was in the semirecumbent position. If the patient's dysphagia persists, a modified barium swallowing study was recommended.   HISTORY OF PRESENT ILLNESS:  The patient is an 81 year old Caucasian female with a history of chronic respiratory failure, history of bilateral pleural effusions, congestive heart failure, who presents to the hospital with complaints of worsening dysphagia. She reported problems with food feeling as if it is stuck in the middle of her chest. Please refer to Dr. Governor Specking admission note on the 13th of April 2015. On arrival to the hospital, the patient's physical examination was unremarkable. Her temperature was 97.8, pulse was 102, blood pressure 122/75, saturation was 96% to 97% on 2 liters of oxygen through nasal cannula. The patient  was in atrial fibrillation. She did have right facial droop which is chronic as well as slurred speech and some expressive aphasia, also weakness in the right upper extremity and left lower extremity.   LABORATORY DATA:  The patient's lab data done on admission revealed normal BMP. The patient's liver enzymes  revealed elevation of total bilirubin to 1.3, albumin level of 3.2, alkaline phosphatase 133. Troponin was less than 0.02. CBC was within normal limits with white blood cell count 6.2, hemoglobin was 14.7 and platelet count 196. Urinalysis revealed 5 to 15 red blood cells and 0 to 5 white blood cells, 1+ bacteria but negative for nitrites or leukocyte esterase. EKG showed poor data quality, interpretation may be adversely affected, atrial fibrillation with rapid ventricular response with rate of 106, nonspecific T wave abnormality but no significant change since prior EKG.   HOSPITAL COURSE:  The patient was admitted to the hospital for further evaluation. She had CT scan of her head done which was unremarkable. She also underwent CT scan of her neck with contrast which revealed distal left vertebral artery occlusion at C1 to C2 level which was new since 2012. It did not have any acute posterior fossa infarction identified, however, MRI was suggested. MRI was performed on the 14th of April 2015, and it did not show any significant abnormalities. The patient was evaluated by neurologist, Dr. Valora Corporal, who saw the patient in consultation on the 14th of April 2015. Dr. Tamala Julian felt that the patient's dysphagia did not appear to be neurologic in origin, definitely not worsened by new stroke as the patient had no history to support motor neuron disease or myasthenia as causes and he felt that this is likely GI in nature. In regards to left vertebral occlusion, it was asymptomatic and no further followup was recommended for left basal ganglia infarct as the patient was stable with right hemiparesis. He recommended to continue antiplatelet therapy as well as statin.  He recommended also to get a barium swallowing study done and possibly GI evaluation if needed but no operative therapy for left vertebral occlusion. Barium swallowing study was performed on the 15th of April 2015, and it appears that patient was able to  pass barium pill. The patient was evaluated by speech therapist, Belenda Cruise, who felt that the patient is to continue small amount of pureed diet with thin liquids. With this, the patient was able to swallow her food. The patient is to continue followup with her primary care physician as well as hospice as outpatient.  The patient is being discharged in stable condition with the above-mentioned medications and followup.   VITAL SIGNS: On the day of discharge, temperature is 97.8, pulse was 96, respiratory rate was 18, blood pressure 119/78, saturation was 95% on room air at rest.   TIMEM SPENT:  40 minutes.    ____________________________ Theodoro Grist, MD rv:cs D: 03/11/2014 16:17:00 ET T: 03/11/2014 20:18:59 ET JOB#: [id number]  cc: Marguerita Merles, MD Theodoro Grist, MD, <Dictator>  [place] MD ELECTRONICALLY SIGNED 03/28/2014 18:01

## 2015-03-20 NOTE — Consult Note (Signed)
Brief Consult Note: Diagnosis: peripheral edema/afib/old right cva.   Patient was seen by consultant.   Consult note dictated.   Recommend further assessment or treatment.   Comments: 81 yo female with history of right body cva which is not acute who was admitted with progressive shortness of breath while ambulating with her husband and noted to have increased peripheral edema. She was brought to the er and noted to have afib. Pt has no evidence of afib on ekgs here at [place] but pt states she was admitted to a hospital somewhere (pt is not sure which one) and she was told her heart was "cutting up but nothing could be done".  SHe has been aware of irregular heart beat for several years. Her CHADSS score is 5.  She denies falls or head trauma. Her cva is chronic. Would recommend warfarin or xarelto at discharge if no contraindication neurologically. Echo reveals preserved lv funciton but has increased right sided pressures and tr suggesting pulmonary hypertension which is likely at least the cause of part of her peripheral edema. This may also explain her right pleural effusion. There was no obvious pulmonary edema noted on cxr. Will need to rule out pulmonary emboli as well. This would be  another indication for anticoagulation..  Electronic Signatures: Teodoro Spray (MD)  (Signed 01-Jan-15 12:59)  Authored: Brief Consult Note   Last Updated: 01-Jan-15 12:59 by Teodoro Spray (MD)

## 2015-03-20 NOTE — Consult Note (Signed)
Referring Physician:  Epifanio Lesches :   Primary Care Physician:   Epifanio Lesches : [facility], [facility], [address], [place], [place] [address]  Reason for Consult: Admit Date: 10-Mar-2014  Chief Complaint: dysphagia  Reason for Consult: CVA   History of Present Illness: History of Present Illness:   81 yo RHD F with prior hx of stroke that caused dysphagia requiring a PEG presents to [facility] secondary to worsening dysphagia.  Pt states that she has been overall weak for the past few days due to not eating.  Pt denies fluctuating weakness and shortness of breath.  Pt states that dysphagia has been worse over the past few weeks and that it is the same with solids and liquids.    ROS:  General weakness    HEENT no complaints    Lungs no complaints    Cardiac no complaints    GI nausea    GU no complaints    Musculoskeletal no complaints    Extremities no complaints    Skin no complaints    Neuro no complaints    Endocrine no complaints    Psych no complaints    Past Medical/Surgical Hx:  Dysphagia: oral phase - baseline  Hypothyroidism:   Blind in right eye. 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:   brain tumor surgery:   Past Medical/ Surgical Hx:  Past Medical History as above   Past Surgical History as above   Home Medications: Medication Instructions Last Modified Date/Time  lovastatin 40 mg oral tablet 1 tab(s) orally once a day (in the evening) for high cholesterol 13-Apr-15 13:33  furosemide 20 mg oral tablet 2 tab(s) orally once a day (in the morning) 13-Apr-15 13:33  Senna Plus 50 mg-8.6 mg oral tablet 1-2 tab(s) orally 2 times a day 13-Apr-15 13:33  quinapril 10 mg oral tablet 1 tab(s) orally 2 times a day for  blood pressure 13-Apr-15 13:33  levothyroxine 50 mcg (0.05 mg) oral tablet 1 tab(s) orally once a day 13-Apr-15 13:33  gabapentin 100 mg oral capsule 1 cap(s) orally once a day (at bedtime) 13-Apr-15 13:33  atenolol 50 mg oral tablet 2 tab(s) orally once a day (in the morning) and 1 tablet every evening for high blood pressure 13-Apr-15 13:33  Klor-Con 10 mEq oral tablet, extended release 1 tab(s) orally once a day for potassium 13-Apr-15 13:33  NIFEdipine 60 mg oral tablet, extended release 1 tab(s) orally once a day 13-Apr-15 13:33  morphine 20 mg/mL oral concentrate 0.25 milliliter(s) orally every 2 hours, As Needed - for Shortness of Breath 13-Apr-15 13:33   Allergies:  Other -Explain in Comment: Other  Allergies:  Allergies NKDA    Social/Family History: Employment Status: retired  Lives With: alone  Living Arrangements: assisted living  Social History: no tob, no EtOH, no illicits  Family History: no seizure, no stroke   Vital Signs: **Vital Signs.:   14-Apr-15 16:12  Vital Signs Type Q 4hr  Temperature Temperature (F) 97.4  Celsius 36.3  Temperature Source oral  Pulse Pulse 110  Respirations Respirations 20  Systolic BP Systolic BP 914  Diastolic BP (mmHg) Diastolic BP (mmHg) 82  Mean BP 94  Pulse Ox % Pulse Ox % 94  Pulse Ox Activity Level  At rest  Oxygen Delivery Room Air/ 21 %   Physical Exam: General: nl weight, NAD  HEENT: R facial sx effecting R eye, sclera nonicteric,  Neck: supple, no JVD, no bruits  Chest: CTA B, no wheezing, good movement  Cardiac: RRR, no murmurs, no edema, 2+ pulses  Extremities: no C/C/E, FROM   Neurologic Exam: Mental Status: alert and oriented x 3, normal speech and language, follows complex commands  Cranial Nerves: PERRLA, EOMI, nl VF, face symmetric, tongue midline, shoulder shrug equal, absent R eye  Motor Exam: 2/5 R with contracture, 5 /5 L  Deep Tendon Reflexes: babinski on R with brisk reflexes  Sensory Exam: decreased  temp on R   Lab Results:  Hepatic:  13-Apr-15 13:08   Bilirubin, Total  1.3  Alkaline Phosphatase  133 (45-117 NOTE: New Reference Range 10/17/13)  SGPT (ALT) 22  SGOT (AST) 26  Total Protein, Serum 7.4  Albumin, Serum  3.2  Routine Chem:  14-Apr-15 06:43   Glucose, Serum  61  BUN 12  Creatinine (comp) 0.90  Sodium, Serum 142  Potassium, Serum 3.8  Chloride, Serum 102  CO2, Serum 32  Calcium (Total), Serum 8.9  Anion Gap 8  Osmolality (calc) 281  eGFR (African American) >60  eGFR (Non-African American)  59 (eGFR values <27m/min/1.73 m2 may be an indication of chronic kidney disease (CKD). Calculated eGFR is useful in patients with stable renal function. The eGFR calculation will not be reliable in acutely ill patients when serum creatinine is changing rapidly. It is not useful in  patients on dialysis. The eGFR calculation may not be applicable to patients at the low and high extremes of body sizes, pregnant women, and vegetarians.)  Cardiac:  13-Apr-15 13:08   Troponin I < 0.02 (0.00-0.05 0.05 ng/mL or less: NEGATIVE  Repeat testing in 3-6 hrs  if clinically indicated. >0.05 ng/mL: POTENTIAL  MYOCARDIAL INJURY. Repeat  testing in 3-6 hrs if  clinically indicated. NOTE: An increase or decrease  of 30% or more on serial  testing suggests a  clinically important change)  Routine UA:  13-Apr-15 13:08   Color (UA) AMBER  Clarity (UA) CLEAR  Glucose (UA) NEGATIVE  Bilirubin (UA) NEGATIVE  Ketones (UA) 3+  Specific Gravity (UA) 1.015  Blood (UA) 3+  pH (UA) 6.0  Protein (UA) 30 mg/dL  Nitrite (UA) NEGATIVE  Leukocyte Esterase (UA) NEGATIVE (Result(s) reported on 09 Mar 2014 at 03:43PM.)  RBC (UA) 5-15  WBC (UA) 0-5  Bacteria (UA) 1+ (TRACE/FEW)  Epithelial Cells (UA) 0-5 / HPF  Hyaline Cast (UA) 0-5 / LPF  Result(s) reported on 09 Mar 2014 at 03:43PM.  Routine Hem:  14-Apr-15 06:43   WBC (CBC) 5.8  RBC (CBC) 3.93  Hemoglobin (CBC) 12.8  Hematocrit  (CBC) 39.5  Platelet Count (CBC) 163  MCV  101  MCH 32.6  MCHC 32.5  RDW  16.5  Neutrophil % 79.1  Lymphocyte % 10.5  Monocyte % 7.9  Eosinophil % 1.6  Basophil % 0.9  Neutrophil # 4.6  Lymphocyte #  0.6  Monocyte # 0.5  Eosinophil # 0.1  Basophil # 0.1 (Result(s) reported on 10 Mar 2014 at 07:15AM.)   Radiology Results: MRI:    14-Apr-15 10:38, MRI Brain Without Contrast  MRI Brain Without Contrast   REASON FOR EXAM:    81yr old female with previous strokes comes in with   worsening dysphagia/  COMMENTS:       PROCEDURE: MR  - MR  BRAIN WO CONTRAST  - Mar 10 2014 10:38AM     CLINICAL DATA:  81 year old female with difficulty swallowing.  Distal left vertebral artery occlusion on recent neck CT new since  2012. Worsening dysphagia. Initial encounter. Remote history of  "Benign brain tumor removal on the right" .    EXAM:  MRI HEAD WITHOUT CONTRAST    TECHNIQUE:  Multiplanar, multiecho pulse sequences of the brain and surrounding  structures were obtained without intravenous contrast.    COMPARISON:  neck CT 03/09/2014 and earlier.  Brain MRI 08/10/2006.    FINDINGS:  No restricted diffusion or evidence of acute infarction.    Stable major intracranial vascular flow voids compared to  yesterday's exam, with absence of the distal left vertebral artery  flow void.    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 Absent right  cisternal and intra canalicular seventh and eighth cranial nerves  segments. Superimposed chronic hemorrhagic infarct of the left  corona radiata tracking to the left cerebral peduncle. Associated  chronic blood products in both of these areas.    Additional nonspecific scattered cerebral white matter T2 and FLAIR  hyperintensity. Chronic ventricular prominence, ex vacuo related.    No midline shift, mass  effect, or evidence of intracranial mass  lesion. No acute intracranial hemorrhage identified. Negative  pituitary, cervicomedullary junction and visualized cervical spine.  Normal bone marrow signal.    Visualized orbit soft tissues are within normal limits. Negative  paranasal sinuses and left mastoids. No acute scalp soft tissue  findings.     IMPRESSION:  1. No evidence of acute infarct. This despite the recently  discovered distal left vertebral artery occlusion.  2. Chronic left MCA infarct with chronic blood products,  encephalomalacia, and some Wallerian degeneration.  3. 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      Electronically Signed    By: Lars Pinks M.D.    On: 03/10/2014 10:55     Verified By: Gwenyth Bender. HALL, M.D.,  CT:    13-Apr-15 16:30, CT Head Without Contrast  CT Head Without Contrast   REASON FOR EXAM:    difficulty swallowing  COMMENTS:       PROCEDURE: CT  - CT HEAD WITHOUT CONTRAST  - Mar 09 2014  4:30PM     CLINICAL DATA:  81 year old female with difficulty swallowing. [facility]. Initial encounter.    EXAM:  CT HEAD WITHOUT CONTRAST    TECHNIQUE:  Contiguous axial images were obtained from the base of the skull  through the vertex without intravenous contrast.  COMPARISON:  Head CT without contrast 11/06/2011.    FINDINGS:  Stable appearance of rightmastoidectomy. Other Visualized paranasal  sinuses and mastoids are clear. No acute or suspicious osseous  lesion in the skull. No acute orbit or scalp soft tissue findings.    Chronic right cerebellar infarct. Chronic left MCA white matter  infarct also affecting the leftbasal ganglia. Stable cerebral  volume. Stable ventricle size and configuration. No midline shift,  mass effect, or evidence of intracranial mass lesion. No evidence of  cortically based acute infarction identified. Stable intracranial  vascular density.      IMPRESSION:  Stable chronic ischemic disease.  No acute intracranial abnormality.      Electronically Signed    By: Lars Pinks M.D.    On: 03/09/2014 16:45  Verified By: Gwenyth Bender. HALL, M.D.,   Radiology Impression: Radiology Impression: MRI personally reviewed by me and shows old L BG infarct, no acute infarcts   Impression/Recommendations: Recommendations:   labs personally reviewed by me notes reviewed by me   Dysphagia-  this does not appear neurologic in origin;  definitely not worsened by new stroke and no hx to support motor neuron dz or myasthenia as causes;   sounds more GI in nature L vertebral occlusion-  asymptomatic, no further w/u L BG infarct-  stable with R hemiparesis continue antiplatelets and statin recommend barium swallow and possible GI evaluation per primary no operative therapy for L vertebral will sign off, please call with questions  Electronic Signatures: Jamison Neighbor (MD)  (Signed 15-Apr-15 00:22)  Authored: REFERRING PHYSICIAN, Primary Care Physician, Consult, History of Present Illness, Review of Systems, PAST MEDICAL/SURGICAL HISTORY, HOME MEDICATIONS, ALLERGIES, Social/Family History, NURSING VITAL SIGNS, Physical Exam-, LAB RESULTS, RADIOLOGY RESULTS, Recommendations   Last Updated: 15-Apr-15 00:22 by Jamison Neighbor (MD)

## 2015-03-20 NOTE — H&P (Signed)
PATIENT NAME:  Faith Donovan, ALEN MR#:  [id number] DATE OF BIRTH:  Jun 19, 1928  DATE OF ADMISSION:  01/27/2014  PRIMARY CARE PHYSICIAN: [facility], Marguerita Merles, MD  REFERRING PHYSICIAN: Briant Sites. Joni Fears, MD  CHIEF COMPLAINT:  Leg swelling and shortness of breath.   HISTORY OF PRESENT ILLNESS: This is a very nice 81 year old female accompanied by her husband. They are both very poor historians. The patient apparently was admitted here on December 31st for leg swelling and shortness of breath as well and, since then, the fluid has not really gone away completely. The patient actually had a stroke and since then she has difficulty moving her right upper extremity. She is able to walk with a walker but she does seem to get too far along. The patient states that it within the past 3 weeks, she has been having significant shortness of breath and increased swelling. She was seen by her primary care physician within the past couple of weeks and she got 1 shot of Lasix intramuscular and then for followup she went to see a different doctor who saw her today.  They did not want to repeat the same treatment, rather sent her to the Emergency Department. In the Emergency Department, the patient is stable. Her oxygen saturation is 91% on room air, for what the patient was put on 1 liter of oxygen to facilitate her breathing. She was breathing up to 26 a minute but right now after resting she is 18 with oxygen saturation 94%. The patient has +2 edema but she is hemodynamically stable. Her last echocardiogram done on a previous admission showed an ejection fraction of 60% to 81%, mostly diastolic dysfunction. She has a right-sided pleural effusion. The last time she was here, she was diagnosed with new-onset atrial fibrillation and acute community-acquired pneumonia on top of her acute diastolic failure. The patient was offered anticoagulation for her new-onset atrial fibrillation for what Xarelto was started.  At this  moment, Xarelto has been continued and the patient has not had any issues with bleeding. The patient takes furosemide orally but it does not seem like it is taking care of all the fluid the way that she wants. The patient is admitted for observation of acute exacerbation of diastolic dysfunction. Again, the patient is a very poor historian and she is unable to give more details like she does not know if she is gaining weight. She does not check her weight daily and she states that she is more short of breath.   REVIEW OF SYSTEMS: CONSTITUTIONAL: No fever or fatigue.  EYES: The patient had a tumor on her right eye which was benign and it was removed. She cannot see anything out of that eye. No blurry vision.  ENT: No tinnitus or difficulty swallowing. The patient has a right facial droop.  RESPIRATORY: No cough, wheezing. Positive shortness of breath.  CARDIOVASCULAR: Positive edema. No chest pain, no orthopnea, no syncope.  GASTROINTESTINAL: No nausea, vomiting, constipation.  GENITOURINARY: No dysuria, hematuria.  GYNECOLOGIC: No breast masses.  HEMATOLOGIC AND LYMPHATIC: No anemia or easy bruising.  ENDOCRINE: No polyuria or polydipsia.  MUSCULOSKELETAL: No significant neck pain or back pain.  NEUROLOGIC: No numbness or weakness.  PSYCHIATRIC: No anxiety or insomnia.   PAST MEDICAL HISTORY: 1.  CVA in 1986.  2.  Right side weakness with facial droop.  2.  Chronic contractures.  3.  Hypertension.  4.  Hypothyroidism.   PAST SURGICAL HISTORY:  Removal of a right eye tumor.  SOCIAL HISTORY: The patient lives with her husband. She gets around with a walker or in a wheelchair. She used to smoke but she quit in 1986. She does not drink or do drugs.   FAMILY HISTORY: Positive for stroke in her father and cancer in her mother.   ALLERGIES: No known drug allergies.   MEDICATIONS: Xarelto 20 mg once a day, quinapril 10 mg twice daily, nifedipine 60 mg once a day, lovastatin 40 mg once a day,  levothyroxine 50 mcg once a day, Klor-Con 10 mEq once daily, gabapentin 100 mg once daily, furosemide 20 mg once daily, atenolol 50 mg take 2 tablets once a day.   PHYSICAL EXAMINATION: VITAL SIGNS: Blood pressure 118/62, pulse 96, respirations 18, temperature has not been documented yet, oxygen saturation 91% on room air.  GENERAL: The patient is alert and oriented x 3, in no acute distress, cooperative but a very poor historian.  HEENT: Her pupils are unable to compare due to a tumor on the right eye that was removed, leaving the patient with significant ptosis deformity of the eye. Her left pupil is round and reactive. Extraocular movements are intact. The patient has a right facial droop. Tongue is central. Uvula is central. Her mucosa are moist. Anicteric sclerae. Pink conjunctivae. No oral lesions seen.  NECK: Supple. No JVD. No thyromegaly. No adenopathy. No carotid bruits. No rigidity.  CARDIOVASCULAR: Regular rate and rhythm. No murmurs, rubs or gallops are appreciated. The patient has been recently diagnosed with atrial fibrillation but her rate was very regular on examination. Her EKG shows atrial fibrillation. No rubs or gallops. No displacement of PMI.  LUNGS: Clear with decreased respiratory sounds, mostly on the right side of her chest. No use of accessory muscles, no tachypnea at this moment although when the patient presented to ER she was tachypneic.  ABDOMEN: Soft, nontender, nondistended. No hepatosplenomegaly. No masses. Bowel sounds are positive.  GENITAL: Deferred.  EXTREMITIES: No cyanosis or clubbing. Positive pitting edema +2.  NEUROLOGIC: As mentioned above, the patient has right upper extremity weakness for the most. She has right lower extremity weakness but compared with the left side right now the  difference is minimal. She has contractures at the level of the right extremity, mostly the hand.  Cranial nerves are stable from previous exams. 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 Other cranial nerves seem to be stable.  PSYCHIATRIC: No significant agitation. The patient is alert, oriented x 3.  MUSCULOSKELETAL: No significant joint effusions.  SKIN: No rashes or petechiae, normal turgor.  LYMPHATIC: Negative for lymphadenopathy in neck or supraclavicular areas.   LABORATORY, DIAGNOSTIC AND RADIOLOGICAL DATA:   1.  Chest x-ray significant moderate to large size, right more than left pleural effusions and cardiomegaly without any acute pulmonary edema.  2.  Her echo on last admission ejection fraction 70% to 62%, diastolic dysfunction.  3.  Her BNP is 8200.  4.  Her BUN is 20 and her creatinine is 1.35, leaving her with a GFR around 35 to 40.  5.  Total protein 7.3, bilirubin 1.2, alkaline phosphatase 127.  6.  White count is 5.6, hemoglobin is 14.3, platelet count 162.  7.  EKG today, atrial fibrillation, heart around 100. No significant ST depression or elevation. Poor Q wave progression onseptal leads.     ASSESSMENT AND PLAN: An 81 year old female with history of congestive heart failure, diastolic, comes back with the same problems as previous admission, leg  edema and shortness of breath. At this moment, the patient does not look to be in any significant distress but is complaining of swelling and shortness of breath.  1.  Shortness of breath. The patient does not quite meet full criteria for acute respiratory failure. Her oxygen saturation was only 91% although she was tachypneic. The patient is going to be admitted for some Lasix and monitoring. No need to repeat an echocardiogram today.  2.  Diastolic congestive heart failure. We are going to give her Lasix 20 mg IV daily because her creatinine is elevated from her baseline. At this moment, I think she can tolerate that. We are going to monitor closely and hold the Lasix tomorrow if her creatinine is worse. Her electrolytes are within normal limits. No need to repeat an  echocardiogram as the patient had a recent one. Continue her beta blocker, hold her ACE inhibitor.  3.  Acute kidney injury. Actually, this is a bump from her baseline. Her baseline is around 1 to 1.1, today is 1.35 which is a decrease in her baseline from 50 to 40 GFR. is borderline. I think at this moment we can at least give her 1 dose of Lasix and monitor her kidney function. Hold any other nephrotoxic drugs.  4.  History of previous cerebrovascular accident and atrial fibrillation. Atrial fibrillation is rate controlled. Continue Xarelto and beta blocker.  5.  Hypertension. Continue nifedipine, hold quinapril.  6.  Hypothyroidism. Continue levothyroxine. 7.  Other medical problems seem to be stable.  8.  Gastrointestinal prophylaxis with Protonix.  9.  Deep vein thrombosis prophylaxis with Xarelto.  Since the patient has a GFR around 40, there is no need to change the dose but if it is worsening we have to lower it to 50 mg.  10.  The patient is a DO NOT RESUSCITATE.   TIME SPENT: I spent about 45 minutes with this admission.    ____________________________ [place] Sink, MD rsg:cs D: 01/27/2014 17:40:00 ET T: 01/27/2014 18:05:00 ET JOB#: [id number]  cc: Friendship Sink, MD, <Dictator> Katalyn Matin America Brown MD ELECTRONICALLY SIGNED 02/05/2014 16:23

## 2015-03-20 NOTE — Discharge Summary (Signed)
PATIENT NAME:  Faith Donovan, Faith Donovan MR#:  [id number] DATE OF BIRTH:  [birth year]  DATE OF ADMISSION:  01/28/2014 DATE OF DISCHARGE:  02/02/2014  PRIMARY CARE PHYSICIAN: Dr. Delight Stare.   FINAL DIAGNOSES:  1.  Acute respiratory failure, now chronic.  2.  Pleural effusion.  3.  Acute diastolic congestive heart failure.  4.  Old cerebrovascular accident.  5.  Chronic atrial fibrillation.  6.  Hypertension.  7.  Neuropathy.  8.  Hypothyroidism.   MEDICATIONS ON DISCHARGE: Include levothyroxine 50 mcg daily, gabapentin 100 mg at bedtime, atenolol 50 mg 2 tablets in the morning and 1 tablet in the evening, Klor-Con 10 mEq daily, nifedipine ER 60 mg daily, furosemide 20 mg 2 tablets daily, Roxanol 20 mg/mL 0.25 mL every 2 hours as needed for pain or shortness of breath, Ensure 240 mL daily. Stop taking quinapril. Stop taking Xarelto. Stop taking lovastatin. Oxygen 5 L nasal cannula. Regular diet. Ensure once a day. Mechanical soft diet with thin liquids and ground meats, gravy added to moisten, strict aspiration precautions, meds in puree. The patient will be sent home with hospice. Follow up in 1 to 2 weeks with Dr. Delight Stare.   HOSPITAL COURSE: The patient was admitted January 27, 2014 and discharged February 02, 2014. The patient came in with leg swelling and shortness of breath. Admitted with diastolic congestive heart failure. The patient was started on IV Lasix.  LABORATORY AND RADIOLOGICAL DATA DURING THE HOSPITAL COURSE: Included an EKG that showed atrial fibrillation, rapid ventricular response. Troponin negative. Glucose 97, BUN 20, creatinine 1.35, sodium 139, potassium 3.5, chloride 99, CO2 of 32, calcium 9.2, alkaline phosphatase 127, total bilirubin 1.2. ALT 21, AST 37, total protein 7.3. White blood cell count 5.6, hemoglobin and hematocrit 14.3 and 43.9, platelet count 162. BNP 8220. Chest x-ray showed continued moderate large right greater than left pleural effusions. The patient had a  thoracentesis on March 5, that showed a pH of 6.5. LDH 74. Glucose 98. Protein 2. Chest x-ray post-thoracentesis showed no pneumothorax, right pleural effusion has resolved. The next day, the patient had a chest x-ray secondary to shortness of breath, which showed that the pleural effusion had re-accumulated. ABG showed a pH of 7.49, pCO2 45, pO2 of 65, bicarbonate 34.3, oxygen saturation 95.1 on a non-rebreather. Ultrasound of the right upper extremity was negative.   [place] COURSE: Included physical therapy, speech therapy, Dr. Genevive Bi cardiothoracic surgery, Dr. Ermalinda Memos palliative care.   HOSPITAL COURSE PER PROBLEM LIST:  1.  Acute respiratory failure, likely secondary to the pleural effusion and heart failure. The patient had a thoracentesis and pleural effusion re-accumulated the next day and was put on 100% nonrebreather. The patient was tapered down to 4 L and now up to 5 L. This is now chronic respiratory failure. I did ask Dr. Ermalinda Memos to see the patient to determine whether or not symptomatically the patient would benefit from a catheter placed in to drain the pleural effusion. The patient felt comfortable and wanted to go home and did not want any further procedures. The patient was sent home with hospice with the goal of comfort. Roxanol given for shortness of breath.  2.  Pleural effusion. This was tapped with a thoracentesis and re-accumulated right away, likely secondary to heart failure.  3.  Acute diastolic congestive heart failure. One side showing up more on the pleural effusion than the other. The patient was diuresed with IV Lasix here. I do not think the Lasix  will help too much since the pleural effusion just re-accumulated very rapidly. The patient will continue to be on Lasix and atenolol.  4.  Old CVA. I stopped the Xarelto. Goal of comfort care least medications as possible. Can continue aspirin.  5.  Chronic atrial fibrillation, aspirin for anticoagulation.   6.  Hypertension. Blood pressure on the lower side here. Continue nifedipine and atenolol for rate control.  7.  Neuropathy on gabapentin at night.  TIME SPENT ON DISCHARGE: 35 minutes.  ____________________________ Tana Conch. Leslye Peer, MD rjw:aw D: 02/02/2014 14:33:16 ET T: 02/02/2014 14:43:54 ET JOB#: [id number]  cc: Tana Conch. Leslye Peer, MD, <Dictator> Marisue Brooklyn MD ELECTRONICALLY SIGNED 02/05/2014 16:22

## 2015-03-20 NOTE — Discharge Summary (Signed)
PATIENT NAME:  Faith Donovan, GODA MR#:  [id number] DATE OF BIRTH:  [birth year]  DATE OF ADMISSION:  11/26/2013 DATE OF DISCHARGE:  11/28/2013  ADMISSION DIAGNOSES: 1.  Acute congestive heart failure. 2.  New-onset atrial fibrillation.  DISCHARGE DIAGNOSES: 1.  Acute diastolic heart failure.  2.  New-onset atrial fibrillation.  3.  Community-acquired pneumonia. 4.  Hypertension. 5.  Hypothyroidism.  6.  History of cerebrovascular accident.   CONSULTATIONS: Dr. Ubaldo Glassing.   A 2-D echocardiogram showed normal ejection fraction of 60% to 65%. She does have moderate to severe TR and mildly elevated pulmonary arterial systolic pressures.   CT of the chest showed no evidence of a pulmonary emboli.   LABORATORY DATA: Discharge sodium 142, potassium 3.2, which was repleted prior to discharge, chloride 103, bicarbonate 34, BUN 13, creatinine 1.03, glucose is 75.   HOSPITAL COURSE: This is a very pleasant 81 year old female with a history of CVA who presented with shortness of breath and a cough, was found to have acute diastolic heart failure and pneumonia. For details please refer to the H and P.   1.  Acute diastolic heart failure. The patient has right-sided heart failure with a normal ejection on echocardiogram. Cardiology was consulted. She was diuresed using Lasix and diuresed well. She will continue on p.o. Lasix at home. We did do a CT of the chest due to the echocardiogram pointing to the right heart as the issue and this essentially was negative for a pulmonary emboli. She does have a right pleural effusion likely for right heart failure and may also be from pneumonia. Initially I thought maybe she has aspiration pneumonia, so I had speech see her and it does not appear that she has any kind of aspiration issues.  2.  New-onset atrial fibrillation. I did discuss in great detail about side effects, risks, alternatives and benefits of anticoagulation as her [place] score was 5.  We reviewed internal  bleeding as one of the main side effects. The patient did want to start anticoagulation and she preferred to use Xarelto.  She did have a benign brain tumor surgery in 1993, which is not a contraindication to Xarelto. 3.  She was on diltiazem and Coreg, which we made some medication changes at discharge.  4.  Pneumonia. We did start Levaquin.  5.  Hypertension, well controlled.  6.  Hypothyroidism. The patient will continue on her Synthroid.  7.  History of cerebrovascular accident. We switched her aspirin to Xarelto at this time to decrease bleeding risk.   DISCHARGE MEDICATIONS: 1.  Quinapril 10 mg b.i.d.  2.  Synthroid 50 mcg daily.  3.  Gabapentin 100 mg daily.  4.  Atenolol 50 mg 2 tablets in the morning and 1 in the evening.  5.  Lasix 20 mg daily.  6.  Levaquin 750 mg q. 48 hours x 7 days.  7.  Xarelto 15 mg daily. This was dosed renally.   DISCHARGE ACTIVITY: As tolerated.  Discharge to home health with physical therapy, nurse and speech.   DISCHARGE DIET: Mechanical soft.  DISCHARGE FOLLOWUP:  The patient will follow up with Dr. Ubaldo Glassing next week.  TIME SPENT: Approximately 35 minutes.  The patient is medically stable for discharge.   ____________________________ Imberly Troxler P. Benjie Karvonen, MD spm:ce D: 11/28/2013 15:32:50 ET T: 11/28/2013 16:24:08 ET JOB#: [id number]  cc: Levette Paulick P. Benjie Karvonen, MD, <Dictator> Javier Docker. Ubaldo Glassing, MD Donell Beers Janay Canan MD ELECTRONICALLY SIGNED 11/28/2013 21:56

## 2015-07-05 ENCOUNTER — Other Ambulatory Visit: Payer: Self-pay

## 2015-07-05 ENCOUNTER — Emergency Department: Payer: Medicare Other

## 2015-07-05 ENCOUNTER — Emergency Department
Admission: EM | Admit: 2015-07-05 | Discharge: 2015-07-05 | Disposition: A | Discharge status: Home / Self Care | Payer: Medicare Other | Attending: Student | Admitting: Student

## 2015-07-05 DIAGNOSIS — J159 Unspecified bacterial pneumonia: Secondary | ICD-10-CM | POA: Insufficient documentation

## 2015-07-05 DIAGNOSIS — R109 Unspecified abdominal pain: Secondary | ICD-10-CM | POA: Diagnosis present

## 2015-07-05 DIAGNOSIS — Z79899 Other long term (current) drug therapy: Secondary | ICD-10-CM | POA: Diagnosis not present

## 2015-07-05 DIAGNOSIS — I10 Essential (primary) hypertension: Secondary | ICD-10-CM | POA: Diagnosis not present

## 2015-07-05 DIAGNOSIS — R1084 Generalized abdominal pain: Secondary | ICD-10-CM

## 2015-07-05 DIAGNOSIS — K5900 Constipation, unspecified: Secondary | ICD-10-CM | POA: Diagnosis not present

## 2015-07-05 DIAGNOSIS — J189 Pneumonia, unspecified organism: Secondary | ICD-10-CM

## 2015-07-05 HISTORY — DX: Malignant (primary) neoplasm, unspecified: C80.1

## 2015-07-05 HISTORY — DX: Essential (primary) hypertension: I10

## 2015-07-05 HISTORY — DX: Pure hypercholesterolemia, unspecified: E78.00

## 2015-07-05 LAB — COMPREHENSIVE METABOLIC PANEL
ALBUMIN: 3.2 g/dL — AB (ref 3.5–5.0)
ALT: 16 U/L (ref 14–54)
ANION GAP: 12 (ref 5–15)
AST: 36 U/L (ref 15–41)
Alkaline Phosphatase: 97 U/L (ref 38–126)
BILIRUBIN TOTAL: 1.1 mg/dL (ref 0.3–1.2)
BUN: 16 mg/dL (ref 6–20)
CO2: 30 mmol/L (ref 22–32)
CREATININE: 0.76 mg/dL (ref 0.44–1.00)
Calcium: 8.5 mg/dL — ABNORMAL LOW (ref 8.9–10.3)
Chloride: 93 mmol/L — ABNORMAL LOW (ref 101–111)
GFR calc non Af Amer: >60 mL/min (ref >60)
GLUCOSE: 134 mg/dL — AB (ref 65–99)
Potassium: 3.4 mmol/L — ABNORMAL LOW (ref 3.5–5.1)
SODIUM: 135 mmol/L (ref 135–145)
Total Protein: 6.3 g/dL — ABNORMAL LOW (ref 6.5–8.1)

## 2015-07-05 LAB — CBC WITH DIFFERENTIAL/PLATELET
BASOS PCT: 1 %
Basophils Absolute: 0 10*3/uL (ref 0–0.1)
EOS ABS: 0 10*3/uL (ref 0–0.7)
EOS PCT: 0 %
HCT: 34 % — ABNORMAL LOW (ref 35.0–47.0)
HEMOGLOBIN: 11.7 g/dL — AB (ref 12.0–16.0)
Lymphocytes Relative: 5 %
Lymphs Abs: 0.4 10*3/uL — ABNORMAL LOW (ref 1.0–3.6)
MCH: 32.5 pg (ref 26.0–34.0)
MCHC: 34.5 g/dL (ref 32.0–36.0)
MCV: 94.3 fL (ref 80.0–100.0)
Monocytes Absolute: 1.1 10*3/uL — ABNORMAL HIGH (ref 0.2–0.9)
Monocytes Relative: 13 %
NEUTROS PCT: 81 %
Neutro Abs: 7.1 10*3/uL — ABNORMAL HIGH (ref 1.4–6.5)
Platelets: 143 10*3/uL — ABNORMAL LOW (ref 150–440)
RBC: 3.61 MIL/uL — AB (ref 3.80–5.20)
RDW: 17.1 % — AB (ref 11.5–14.5)
WBC: 8.7 10*3/uL (ref 3.6–11.0)

## 2015-07-05 LAB — URINALYSIS COMPLETE WITH MICROSCOPIC (ARMC ONLY)
BILIRUBIN URINE: NEGATIVE
Bacteria, UA: NONE SEEN
Glucose, UA: NEGATIVE mg/dL
KETONES UR: NEGATIVE mg/dL
Leukocytes, UA: NEGATIVE
NITRITE: NEGATIVE
PROTEIN: NEGATIVE mg/dL
Specific Gravity, Urine: 1.023 (ref 1.005–1.030)
pH: 7 (ref 5.0–8.0)

## 2015-07-05 LAB — LIPASE, BLOOD: LIPASE: 18 U/L — AB (ref 22–51)

## 2015-07-05 LAB — TROPONIN I

## 2015-07-05 MED ORDER — SODIUM CHLORIDE 0.9 % IV BOLUS (SEPSIS)
500.0000 mL | Freq: Once | INTRAVENOUS | Status: AC
  Administered 2015-07-05: 500 mL via INTRAVENOUS

## 2015-07-05 MED ORDER — IOHEXOL 300 MG/ML  SOLN
75.0000 mL | Freq: Once | INTRAMUSCULAR | Status: AC | PRN
  Administered 2015-07-05: 75 mL via INTRAVENOUS

## 2015-07-05 MED ORDER — AZITHROMYCIN 250 MG PO TABS
500.0000 mg | ORAL_TABLET | Freq: Once | ORAL | Status: AC
  Administered 2015-07-05: 500 mg via ORAL
  Filled 2015-07-05: qty 2

## 2015-07-05 MED ORDER — IOHEXOL 240 MG/ML SOLN
25.0000 mL | INTRAMUSCULAR | Status: AC
  Administered 2015-07-05: 25 mL via ORAL

## 2015-07-05 MED ORDER — AZITHROMYCIN 250 MG PO TABS: ORAL_TABLET | ORAL | Status: DC

## 2015-07-05 MED ORDER — POLYETHYLENE GLYCOL 3350 17 G PO PACK: 17.0000 g | PACK | Freq: Every day | ORAL | Status: AC | PRN

## 2015-07-05 NOTE — ED Provider Notes (Addendum)
[facility] Emergency Department Provider Note  ____________________________________________  Time seen: Seen upon arrival to the emergency department  I have reviewed the triage vital signs and the nursing notes.   HISTORY  Chief Complaint Abdominal Pain    HPI Faith Donovan is a 81 y.o. female with a history of strokeand brain mass who presents with abdominal pain over the past 12 hours. She says the pain is diffuse and cramping. No nausea or vomiting. No pain with urination. No frequent urination. Has not moved her bowels for the past 3-4 days. Usually has one bowel movement per day. Says that the pain is radiating up into her chest. Denies any shortness of breath.   Past Medical History  Diagnosis Date  . Stroke   . Cancer     brain tumor  . High cholesterol   . Hypertension     There are no active problems to display for this patient.   Past Surgical History  Procedure Laterality Date  . Brain surgery    . Abdominal hysterectomy      Current Outpatient Rx  Name  Route  Sig  Dispense  Refill  . atenolol (TENORMIN) 50 MG tablet   Oral   Take 50 mg by mouth 2 (two) times daily.          . furosemide (LASIX) 20 MG tablet   Oral   Take 20 mg by mouth every other day.         . levothyroxine (SYNTHROID, LEVOTHROID) 50 MCG tablet   Oral   Take 50 mcg by mouth daily before breakfast.         . lovastatin (MEVACOR) 40 MG tablet   Oral   Take 40 mg by mouth every evening.          Marland Kitchen omeprazole (PRILOSEC) 20 MG capsule   Oral   Take 20 mg by mouth daily.         Marland Kitchen senna-docusate (SENOKOT-S) 8.6-50 MG per tablet   Oral   Take 1-2 tablets by mouth 2 (two) times daily.           Allergies Aspirin  No family history on file.  Social History History  Substance Use Topics  . Smoking status: Never Smoker   . Smokeless tobacco: Not on file  . Alcohol Use: No    Review of Systems Constitutional: No  fever/chills Eyes: No visual changes. ENT: No sore throat. Cardiovascular: Denies chest pain. Respiratory: Denies shortness of breath. Gastrointestinal:   No nausea, no vomiting.  No diarrhea.   Genitourinary: Negative for dysuria. Musculoskeletal: Negative for back pain. Skin: Negative for rash. Neurological: Negative for headaches, focal weakness or numbness.  10-point ROS otherwise negative.  ____________________________________________   PHYSICAL EXAM:  VITAL SIGNS: ED Triage Vitals  Enc Vitals Group     BP --      Pulse --      Resp --      Temp --      Temp src --      SpO2 --      Weight --      Height --      Head Cir --      Peak Flow --      Pain Score --      Pain Loc --      Pain Edu? --      Excl. in [place]? --     Constitutional: Alert and oriented. Well appearing and  in no acute distress. Eyes: Conjunctivae are normal. PERRL. EOMI. Head: Atraumatic. Nose: No congestion/rhinnorhea. Mouth/Throat: Mucous membranes are moist.  Oropharynx non-erythematous. Neck: No stridor.   Cardiovascular: Normal rate, regular rhythm. Grossly normal heart sounds.  Good peripheral circulation. Respiratory: Normal respiratory effort.  No retractions. Lungs CTAB. Gastrointestinal: Soft with mild diffuse tenderness palpation.. No distention. No abdominal bruits. No CVA tenderness. Rectal exam without any fecal impaction. Heme-negative stool. Musculoskeletal: No lower extremity tenderness nor edema.  No joint effusions. Neurologic:  Garbled speech with right facial droop as well as week 4 out of 5 strength to the right side which is remote from stroke as well as brain tumor. Patient says that this is her baseline.  Skin:  Skin is warm, dry and intact. No rash noted. Psychiatric: Mood and affect are normal. Speech and behavior are normal.  ____________________________________________   LABS (all labs ordered are listed, but only abnormal results are displayed)  Labs Reviewed   CBC WITH DIFFERENTIAL/PLATELET - Abnormal; Notable for the following:    RBC 3.61 (*)    Hemoglobin 11.7 (*)    HCT 34.0 (*)    RDW 17.1 (*)    Platelets 143 (*)    Neutro Abs 7.1 (*)    Lymphs Abs 0.4 (*)    Monocytes Absolute 1.1 (*)    All other components within normal limits  LIPASE, BLOOD - Abnormal; Notable for the following:    Lipase 18 (*)    All other components within normal limits  COMPREHENSIVE METABOLIC PANEL - Abnormal; Notable for the following:    Potassium 3.4 (*)    Chloride 93 (*)    Glucose, Bld 134 (*)    Calcium 8.5 (*)    Total Protein 6.3 (*)    Albumin 3.2 (*)    All other components within normal limits  URINALYSIS COMPLETEWITH MICROSCOPIC ([facility] ONLY) - Abnormal; Notable for the following:    Color, Urine YELLOW (*)    APPearance CLEAR (*)    Hgb urine dipstick 2+ (*)    Squamous Epithelial / LPF 0-5 (*)    All other components within normal limits  TROPONIN I   ____________________________________________  EKG  ED ECG REPORT I, Doran Stabler, the attending physician, personally viewed and interpreted this ECG.   Date: 07/05/2015  EKG Time: 1520  Rate: 95  Rhythm: atrial fibrillation, rate 95  Axis: Normal axis  Intervals:none  ST&T Change: No ST elevations or depressions. No abnormal T-wave inversions.  ____________________________________________  RADIOLOGY  CAT scan of the abdomen without acute findings. Right pleural effusion with an empyema or malignant effusion not being excluded. Likely liver cyst with possible also vascular formation. The exam and findings were reviewed with the patient as well as the patient's husband.  Chest x-ray with improved effusion from previous. ____________________________________________   PROCEDURES    ____________________________________________   INITIAL IMPRESSION / ASSESSMENT AND PLAN / ED COURSE  Pertinent labs & imaging results that were available during my care of the patient  were reviewed by me and considered in my medical decision making (see chart for details).  ----------------------------------------- 5:31 PM on 07/05/2015 -----------------------------------------  Reexamined and patient resting comfortably. Breathing at respiratory rate of 18. We'll antibiosis secondary to possible consolidation of right lung effusion. Patient aware of effusion. Will follow with primary care doctor for repeat chest x-ray. No S3 distress this time. Heart rate in the 90s. Unclear cause of abdominal pain. However no acute findings on that CT the  abdomen and pelvis. ____________________________________________   FINAL CLINICAL IMPRESSION(S) / ED DIAGNOSES  Acute abdominal pain. Acute right-sided pneumonia. Initial visit.    Orbie Pyo, MD 07/05/15 [phone]  Husband requesting ambulance transport the patient home because he says he cannot transfer the patient because of need for help walking. The husband where this may pose a significant cost to him. Further request transport.  Orbie Pyo, MD 07/05/15 [phone]

## 2015-07-05 NOTE — ED Notes (Signed)
Pt brought to ED via EMS w/ c/o bilateral upper abd pain that radiates to chest.  Pt denies n/v/d and diaphoresis. Pt sts it has been 3-4 days since last BM which she claims is not her normal.  Per EMS she takes daily stool softners.  Pt had hx of afib.  Pt hx includes stroke in /'86 and brain tumor in '90.

## 2017-02-18 ENCOUNTER — Encounter: Payer: Self-pay | Admitting: Radiology

## 2017-02-18 ENCOUNTER — Inpatient Hospital Stay
Admission: EM | Admit: 2017-02-18 | Discharge: 2017-02-25 | DRG: 270 | Disposition: E | Payer: Medicare Other | Attending: Internal Medicine | Admitting: Internal Medicine

## 2017-02-18 ENCOUNTER — Other Ambulatory Visit: Payer: Self-pay | Admitting: Vascular Surgery

## 2017-02-18 ENCOUNTER — Emergency Department: Payer: Medicare Other

## 2017-02-18 DIAGNOSIS — R29810 Facial weakness: Secondary | ICD-10-CM | POA: Diagnosis present

## 2017-02-18 DIAGNOSIS — F0390 Unspecified dementia without behavioral disturbance: Secondary | ICD-10-CM | POA: Diagnosis present

## 2017-02-18 DIAGNOSIS — I10 Essential (primary) hypertension: Secondary | ICD-10-CM | POA: Diagnosis not present

## 2017-02-18 DIAGNOSIS — K559 Vascular disorder of intestine, unspecified: Secondary | ICD-10-CM | POA: Diagnosis not present

## 2017-02-18 DIAGNOSIS — R579 Shock, unspecified: Secondary | ICD-10-CM | POA: Diagnosis not present

## 2017-02-18 DIAGNOSIS — R52 Pain, unspecified: Secondary | ICD-10-CM | POA: Diagnosis not present

## 2017-02-18 DIAGNOSIS — Z809 Family history of malignant neoplasm, unspecified: Secondary | ICD-10-CM

## 2017-02-18 DIAGNOSIS — N189 Chronic kidney disease, unspecified: Secondary | ICD-10-CM | POA: Diagnosis not present

## 2017-02-18 DIAGNOSIS — I7410 Embolism and thrombosis of unspecified parts of aorta: Secondary | ICD-10-CM

## 2017-02-18 DIAGNOSIS — N183 Chronic kidney disease, stage 3 (moderate): Secondary | ICD-10-CM | POA: Diagnosis present

## 2017-02-18 DIAGNOSIS — E7800 Pure hypercholesterolemia, unspecified: Secondary | ICD-10-CM | POA: Diagnosis present

## 2017-02-18 DIAGNOSIS — H9190 Unspecified hearing loss, unspecified ear: Secondary | ICD-10-CM | POA: Diagnosis present

## 2017-02-18 DIAGNOSIS — K219 Gastro-esophageal reflux disease without esophagitis: Secondary | ICD-10-CM | POA: Diagnosis present

## 2017-02-18 DIAGNOSIS — N170 Acute kidney failure with tubular necrosis: Secondary | ICD-10-CM | POA: Diagnosis not present

## 2017-02-18 DIAGNOSIS — Z66 Do not resuscitate: Secondary | ICD-10-CM | POA: Diagnosis present

## 2017-02-18 DIAGNOSIS — R059 Cough, unspecified: Secondary | ICD-10-CM

## 2017-02-18 DIAGNOSIS — E039 Hypothyroidism, unspecified: Secondary | ICD-10-CM | POA: Diagnosis present

## 2017-02-18 DIAGNOSIS — Z515 Encounter for palliative care: Secondary | ICD-10-CM | POA: Diagnosis present

## 2017-02-18 DIAGNOSIS — M79605 Pain in left leg: Secondary | ICD-10-CM | POA: Diagnosis not present

## 2017-02-18 DIAGNOSIS — E872 Acidosis: Secondary | ICD-10-CM | POA: Diagnosis present

## 2017-02-18 DIAGNOSIS — Z8673 Personal history of transient ischemic attack (TIA), and cerebral infarction without residual deficits: Secondary | ICD-10-CM | POA: Diagnosis not present

## 2017-02-18 DIAGNOSIS — I129 Hypertensive chronic kidney disease with stage 1 through stage 4 chronic kidney disease, or unspecified chronic kidney disease: Secondary | ICD-10-CM | POA: Diagnosis present

## 2017-02-18 DIAGNOSIS — E785 Hyperlipidemia, unspecified: Secondary | ICD-10-CM | POA: Diagnosis present

## 2017-02-18 DIAGNOSIS — Z90710 Acquired absence of both cervix and uterus: Secondary | ICD-10-CM | POA: Diagnosis not present

## 2017-02-18 DIAGNOSIS — R0989 Other specified symptoms and signs involving the circulatory and respiratory systems: Secondary | ICD-10-CM

## 2017-02-18 DIAGNOSIS — Z823 Family history of stroke: Secondary | ICD-10-CM

## 2017-02-18 DIAGNOSIS — R109 Unspecified abdominal pain: Secondary | ICD-10-CM

## 2017-02-18 DIAGNOSIS — Z886 Allergy status to analgesic agent status: Secondary | ICD-10-CM | POA: Diagnosis not present

## 2017-02-18 DIAGNOSIS — I70223 Atherosclerosis of native arteries of extremities with rest pain, bilateral legs: Secondary | ICD-10-CM | POA: Diagnosis not present

## 2017-02-18 DIAGNOSIS — Z85841 Personal history of malignant neoplasm of brain: Secondary | ICD-10-CM | POA: Diagnosis not present

## 2017-02-18 DIAGNOSIS — Z79899 Other long term (current) drug therapy: Secondary | ICD-10-CM | POA: Diagnosis not present

## 2017-02-18 DIAGNOSIS — N179 Acute kidney failure, unspecified: Secondary | ICD-10-CM | POA: Diagnosis not present

## 2017-02-18 DIAGNOSIS — I7409 Other arterial embolism and thrombosis of abdominal aorta: Secondary | ICD-10-CM | POA: Diagnosis present

## 2017-02-18 DIAGNOSIS — M79604 Pain in right leg: Secondary | ICD-10-CM

## 2017-02-18 DIAGNOSIS — R34 Anuria and oliguria: Secondary | ICD-10-CM | POA: Diagnosis not present

## 2017-02-18 DIAGNOSIS — I700 Atherosclerosis of aorta: Secondary | ICD-10-CM | POA: Diagnosis not present

## 2017-02-18 DIAGNOSIS — R05 Cough: Secondary | ICD-10-CM

## 2017-02-18 DIAGNOSIS — M79606 Pain in leg, unspecified: Secondary | ICD-10-CM | POA: Diagnosis present

## 2017-02-18 HISTORY — DX: Hypothyroidism, unspecified: E03.9

## 2017-02-18 HISTORY — DX: Gastro-esophageal reflux disease without esophagitis: K21.9

## 2017-02-18 LAB — CBC WITH DIFFERENTIAL/PLATELET
BASOS ABS: 0.1 10*3/uL (ref 0–0.1)
Basophils Relative: 1 %
EOS PCT: 1 %
Eosinophils Absolute: 0 10*3/uL (ref 0–0.7)
HCT: 43.3 % (ref 35.0–47.0)
HEMOGLOBIN: 14.6 g/dL (ref 12.0–16.0)
Lymphocytes Relative: 15 %
Lymphs Abs: 1 10*3/uL (ref 1.0–3.6)
MCH: 34 pg (ref 26.0–34.0)
MCHC: 33.7 g/dL (ref 32.0–36.0)
MCV: 100.9 fL — AB (ref 80.0–100.0)
MONO ABS: 0.6 10*3/uL (ref 0.2–0.9)
MONOS PCT: 10 %
Neutro Abs: 4.7 10*3/uL (ref 1.4–6.5)
Neutrophils Relative %: 73 %
Platelets: 180 10*3/uL (ref 150–440)
RBC: 4.29 MIL/uL (ref 3.80–5.20)
RDW: 16.9 % — ABNORMAL HIGH (ref 11.5–14.5)
WBC: 6.4 10*3/uL (ref 3.6–11.0)

## 2017-02-18 LAB — BASIC METABOLIC PANEL
ANION GAP: 10 (ref 5–15)
BUN: 24 mg/dL — ABNORMAL HIGH (ref 6–20)
CALCIUM: 8.4 mg/dL — AB (ref 8.9–10.3)
CHLORIDE: 105 mmol/L (ref 101–111)
CO2: 27 mmol/L (ref 22–32)
Creatinine, Ser: 1.25 mg/dL — ABNORMAL HIGH (ref 0.44–1.00)
GFR calc non Af Amer: 38 mL/min — ABNORMAL LOW (ref >60)
GFR, EST AFRICAN AMERICAN: 44 mL/min — AB (ref >60)
GLUCOSE: 147 mg/dL — AB (ref 65–99)
POTASSIUM: 3.2 mmol/L — AB (ref 3.5–5.1)
Sodium: 142 mmol/L (ref 135–145)

## 2017-02-18 LAB — TYPE AND SCREEN
ABO/RH(D): O POS
ANTIBODY SCREEN: NEGATIVE

## 2017-02-18 LAB — APTT: aPTT: 28 seconds (ref 24–36)

## 2017-02-18 LAB — PROTIME-INR
INR: 1.14
PROTHROMBIN TIME: 14.7 s (ref 11.4–15.2)

## 2017-02-18 LAB — LACTIC ACID, PLASMA: Lactic Acid, Venous: 3.6 mmol/L (ref 0.5–1.9)

## 2017-02-18 MED ORDER — MORPHINE SULFATE (PF) 2 MG/ML IV SOLN
2.0000 mg | Freq: Once | INTRAVENOUS | Status: AC
Start: 2017-02-18 — End: 2017-02-18
  Administered 2017-02-18: 2 mg via INTRAVENOUS

## 2017-02-18 MED ORDER — ACETAMINOPHEN 650 MG RE SUPP: 650.0000 mg | Freq: Four times a day (QID) | RECTAL | Status: DC | PRN

## 2017-02-18 MED ORDER — PANTOPRAZOLE SODIUM 40 MG PO TBEC: 40.0000 mg | DELAYED_RELEASE_TABLET | Freq: Every day | ORAL | Status: DC

## 2017-02-18 MED ORDER — MORPHINE SULFATE (PF) 2 MG/ML IV SOLN
INTRAVENOUS | Status: AC
  Filled 2017-02-18: qty 1

## 2017-02-18 MED ORDER — SODIUM CHLORIDE 0.9 % IV SOLN
INTRAVENOUS | Status: AC
  Administered 2017-02-19: via INTRAVENOUS

## 2017-02-18 MED ORDER — SODIUM CHLORIDE 0.9% FLUSH
3.0000 mL | Freq: Two times a day (BID) | INTRAVENOUS | Status: DC
  Administered 2017-02-19 – 2017-02-21 (×6): 3 mL via INTRAVENOUS

## 2017-02-18 MED ORDER — DIPHENHYDRAMINE HCL 25 MG PO CAPS: 25.0000 mg | ORAL_CAPSULE | Freq: Every evening | ORAL | Status: DC | PRN

## 2017-02-18 MED ORDER — PRAVASTATIN SODIUM 40 MG PO TABS: 40.0000 mg | ORAL_TABLET | Freq: Every day | ORAL | Status: DC

## 2017-02-18 MED ORDER — ONDANSETRON HCL 4 MG PO TABS: 4.0000 mg | ORAL_TABLET | Freq: Four times a day (QID) | ORAL | Status: DC | PRN

## 2017-02-18 MED ORDER — MORPHINE SULFATE (PF) 2 MG/ML IV SOLN
2.0000 mg | Freq: Once | INTRAVENOUS | Status: AC
  Administered 2017-02-18: 2 mg via INTRAVENOUS

## 2017-02-18 MED ORDER — SODIUM CHLORIDE 0.9 % IV SOLN: INTRAVENOUS | Status: DC

## 2017-02-18 MED ORDER — SODIUM CHLORIDE 0.9 % IV BOLUS (SEPSIS)
1000.0000 mL | Freq: Once | INTRAVENOUS | Status: AC
  Administered 2017-02-18: 1000 mL via INTRAVENOUS

## 2017-02-18 MED ORDER — HYDROMORPHONE HCL 1 MG/ML IJ SOLN
1.0000 mg | Freq: Once | INTRAMUSCULAR | Status: AC
  Administered 2017-02-19: 1 mg via INTRAVENOUS
  Filled 2017-02-18: qty 1

## 2017-02-18 MED ORDER — LEVOTHYROXINE SODIUM 50 MCG PO TABS: 50.0000 ug | ORAL_TABLET | Freq: Every day | ORAL | Status: DC

## 2017-02-18 MED ORDER — CEFAZOLIN IN D5W 1 GM/50ML IV SOLN
1.0000 g | Freq: Once | INTRAVENOUS | Status: AC
  Administered 2017-02-19 (×2): 1 g via INTRAVENOUS
  Filled 2017-02-18: qty 50

## 2017-02-18 MED ORDER — IOPAMIDOL (ISOVUE-370) INJECTION 76%
100.0000 mL | Freq: Once | INTRAVENOUS | Status: AC | PRN
  Administered 2017-02-18: 100 mL via INTRAVENOUS

## 2017-02-18 MED ORDER — FAMOTIDINE 20 MG PO TABS: 40.0000 mg | ORAL_TABLET | ORAL | Status: DC | PRN

## 2017-02-18 MED ORDER — ACETAMINOPHEN 325 MG PO TABS: 650.0000 mg | ORAL_TABLET | Freq: Four times a day (QID) | ORAL | Status: DC | PRN

## 2017-02-18 MED ORDER — HEPARIN (PORCINE) IN NACL 100-0.45 UNIT/ML-% IJ SOLN
750.0000 [IU]/h | INTRAMUSCULAR | Status: DC
  Administered 2017-02-18: 750 [IU]/h via INTRAVENOUS
  Filled 2017-02-18: qty 250

## 2017-02-18 MED ORDER — ONDANSETRON HCL 4 MG/2ML IJ SOLN: 4.0000 mg | Freq: Four times a day (QID) | INTRAMUSCULAR | Status: DC | PRN

## 2017-02-18 MED ORDER — METHYLPREDNISOLONE SODIUM SUCC 125 MG IJ SOLR: 125.0000 mg | INTRAMUSCULAR | Status: DC | PRN

## 2017-02-18 MED ORDER — HEPARIN BOLUS VIA INFUSION
3000.0000 [IU] | Freq: Once | INTRAVENOUS | Status: AC
  Administered 2017-02-18: 3000 [IU] via INTRAVENOUS
  Filled 2017-02-18: qty 3000

## 2017-02-18 MED ORDER — HYDROCODONE-ACETAMINOPHEN 5-325 MG PO TABS: 1.0000 | ORAL_TABLET | ORAL | Status: DC | PRN

## 2017-02-18 MED ORDER — ATENOLOL 25 MG PO TABS
50.0000 mg | ORAL_TABLET | Freq: Two times a day (BID) | ORAL | Status: DC
  Administered 2017-02-19: 50 mg via ORAL
  Filled 2017-02-18: qty 2

## 2017-02-18 NOTE — ED Provider Notes (Signed)
[facility] Emergency Department Provider Note  ____________________________________________  Time seen: Approximately 9:20 PM  I have reviewed the triage vital signs and the nursing notes.   HISTORY  Chief Complaint Leg Pain  Level 5 caveat:  Portions of the history and physical were unable to be obtained due to the patient's altered mental status with chronic dementia HPI Faith Donovan is a 83 y.o. female who had a sudden onset of right leg pain that started at about 6:30 PM tonight.  Denies abdominal pain fever or rectal bleeding. No falls or trauma.     Past Medical History:  Diagnosis Date  . Cancer [facility])    brain tumor  . GERD (gastroesophageal reflux disease)   . High cholesterol   . Hypertension   . Hypothyroidism   . Stroke [facility])      Patient Active Problem List   Diagnosis Date Noted  . Leg pain 02/17/2017  . HTN (hypertension) 02/06/2017  . HLD (hyperlipidemia) 02/04/2017  . Hypothyroidism 02/15/2017  . GERD (gastroesophageal reflux disease) 01/31/2017  . Aortic occlusion ([place]) 02/04/2017     Past Surgical History:  Procedure Laterality Date  . ABDOMINAL HYSTERECTOMY    . BRAIN SURGERY       Prior to Admission medications   Medication Sig Start Date End Date Taking? Authorizing Provider  atenolol (TENORMIN) 50 MG tablet Take 50 mg by mouth 2 (two) times daily.     Historical Provider, MD  furosemide (LASIX) 20 MG tablet Take 20 mg by mouth every other day.    Historical Provider, MD  levothyroxine (SYNTHROID, LEVOTHROID) 50 MCG tablet Take 50 mcg by mouth daily before breakfast.    Historical Provider, MD  lovastatin (MEVACOR) 40 MG tablet Take 40 mg by mouth every evening.     Historical Provider, MD  omeprazole (PRILOSEC) 20 MG capsule Take 20 mg by mouth daily.    Historical Provider, MD  polyethylene glycol (MIRALAX) packet Take 17 g by mouth daily as needed for mild constipation or moderate constipation. 07/05/15    Orbie Pyo, MD  senna-docusate (SENOKOT-S) 8.6-50 MG per tablet Take 1-2 tablets by mouth 2 (two) times daily.    Historical Provider, MD     Allergies Aspirin   Family History  Problem Relation Age of Onset  . Cancer Mother   . Stroke Father     Social History Social History  Substance Use Topics  . Smoking status: Never Smoker  . Smokeless tobacco: Not on file  . Alcohol use No    Review of Systems Unable to obtain due to altered mental status.  ____________________________________________   PHYSICAL EXAM:  VITAL SIGNS: ED Triage Vitals  Enc Vitals Group     BP 02/04/2017 1918 (!) 146/101     Pulse Rate 02/12/2017 1918 90     Resp 02/23/2017 1918 20     Temp 02/24/2017 1918 97.9 F (36.6 C)     Temp Source 01/30/2017 1918 Axillary     SpO2 02/11/2017 1913 95 %     Weight 02/15/2017 1917 120 lb (54.4 kg)     Height 02/02/2017 1917 5\' 2"  (1.575 m)     Head Circumference --      Peak Flow --      Pain Score --      Pain Loc --      Pain Edu? --      Excl. in [place]? --     Vital signs reviewed, nursing assessments reviewed.  Constitutional:   Awake, oriented to self. Ill-appearing. Eyes:   No scleral icterus. No conjunctival pallor. PERRL. EOMI.  No nystagmus. ENT   Head:   Normocephalic and atraumatic.   Nose:   No congestion/rhinnorhea. No septal hematoma   Mouth/Throat:   Dry mucous membranes, no pharyngeal erythema. No peritonsillar mass.    Neck:   No stridor. No SubQ emphysema. No meningismus. Hematological/Lymphatic/Immunilogical:   No cervical lymphadenopathy. Cardiovascular:   Tachycardia heart rate 110, irregularly irregular. .  No murmurs.  Palpable femoral pulse bilaterally, left stronger than right. Left popliteal pulse present by Doppler only. Right popliteal pulses absent by palpation and Doppler. Bilateral dorsalis pedis and posterior tibialis pulses absent to palpation and Doppler. Right lower extremity cool to the  touch.  Respiratory:   Normal respiratory effort without tachypnea nor retractions. Breath sounds are clear and equal bilaterally. No wheezes/rales/rhonchi. Gastrointestinal:   Soft and nontender. Non distended. There is no CVA tenderness.  No rebound, rigidity, or guarding. Genitourinary:   deferred Musculoskeletal:   Normal range of motion in all extremities. No joint effusions.  No lower extremity tenderness.  No edema. Neurologic:   Normal speech and language.  CN 2-10 normal. Motor grossly intact. No gross focal neurologic deficits are appreciated.  Skin:    Skin is warm, dry and intact. No rash noted.  No petechiae, purpura, or bullae.  ____________________________________________    LABS (pertinent positives/negatives) (all labs ordered are listed, but only abnormal results are displayed) Labs Reviewed  CBC WITH DIFFERENTIAL/PLATELET - Abnormal; Notable for the following:       Result Value   MCV 100.9 (*)    RDW 16.9 (*)    All other components within normal limits  BASIC METABOLIC PANEL - Abnormal; Notable for the following:    Potassium 3.2 (*)    Glucose, Bld 147 (*)    BUN 24 (*)    Creatinine, Ser 1.25 (*)    Calcium 8.4 (*)    GFR calc non Af Amer 38 (*)    GFR calc Af Amer 44 (*)    All other components within normal limits  LACTIC ACID, PLASMA - Abnormal; Notable for the following:    Lactic Acid, Venous 3.6 (*)    All other components within normal limits  URINE CULTURE  PROTIME-INR  APTT  LACTIC ACID, PLASMA  URINALYSIS, COMPLETE (UACMP) WITH MICROSCOPIC  HEPARIN LEVEL (UNFRACTIONATED)  CBC  TYPE AND SCREEN   ____________________________________________   EKG    ____________________________________________    RADIOLOGY  Ct Angio Ao+bifem W & Or Wo Contrast  Result Date: 01/31/2017 CLINICAL DATA:  Loss of bilateral pedal pulses, with cold right foot and tibia, and pale skin at the left lower extremity. Initial encounter. EXAM: CT ANGIOGRAPHY  OF ABDOMINAL AORTA WITH ILIOFEMORAL RUNOFF TECHNIQUE: Multidetector CT imaging of the abdomen, pelvis and lower extremities was performed using the standard protocol during bolus administration of intravenous contrast. Multiplanar CT image reconstructions and MIPs were obtained to evaluate the vascular anatomy. CONTRAST:  100 mL of Isovue 370 IV contrast COMPARISON:  CT of the abdomen and pelvis performed 07/05/2015 FINDINGS: VASCULAR Aorta: There is acute occlusion of the distal abdominal aorta just below the origin of the mesenteric artery, extending to the bifurcations of the common iliac arteries bilaterally. The external and internal iliac arteries demonstrate reconstitution, likely secondary to collateral vessels along the abdominal wall and within the peritoneum. Scattered calcification is seen along the abdominal aorta and its branches. Celiac: There is  slight narrowing along the celiac trunk. SMA: There appears to be occlusion of one of the branches of the superior mesenteric artery, though it reconstitutes distally. Vague surrounding mesenteric edema is seen. No definite ischemic bowel is seen at this time. Renals: The renal arteries are diminutive but grossly patent bilaterally. IMA: The inferior mesenteric artery remains patent. RIGHT Lower Extremity Inflow: As described above, there is occlusion of the right common iliac artery. There is reconstitution of the right external and internal iliac arteries. Minimal narrowing is suggested along the right common femoral artery. Outflow: The profunda femoris artery appears patent. The superficial femoral artery is only minimally opacified, reflecting more proximal occlusion. Underlying narrowing cannot be excluded. The popliteal artery is grossly intact, though difficult to characterize. Runoff: Runoff to the right ankle is not well characterized due to more proximal occlusion. Flow is noted within the anterior tibial artery. LEFT Lower Extremity Inflow: As  described above, there is occlusion of the left common iliac artery. There is reconstitution of the left external and internal iliac arteries. Minimal narrowing is suggested along the left common femoral artery. Outflow: The profunda femoris artery appears intact. The superficial femoral artery is only minimally opacified, reflecting more proximal collision. Underlying narrowing cannot be excluded. The popliteal artery is not well characterized, though grossly intact. Runoff: Runoff to the left ankle is not well characterized due to more proximal occlusion. There is suggestion of two-vessel runoff to the level of the left calf. Veins: The visualized venous structures are grossly unremarkable, though difficult to fully assess given the phase of contrast enhancement. Review of the MIP images confirms the above findings. NON-VASCULAR Lower chest: Small mildly loculated bilateral pleural effusions are noted. Underlying atelectasis is noted. There is marked enlargement of the right side of the heart, particularly at the right atrium, concerning for right heart insufficiency. There is reflux of contrast into the hepatic veins and IVC. Hepatobiliary: A 2.4 cm hypodensity is noted at the medial right hepatic lobe. The liver is otherwise grossly unremarkable. Small stones are seen within the gallbladder. The gallbladder is otherwise unremarkable. The common bile duct remains normal in caliber. Pancreas: The pancreas is within normal limits. Spleen: The spleen is unremarkable in appearance. Adrenals/Urinary Tract: The adrenal glands are unremarkable in appearance. Right renal cysts are noted. There is no evidence of hydronephrosis. No renal or ureteral stones are identified. No significant perinephric stranding is seen. Stomach/Bowel: The stomach is unremarkable in appearance. The small bowel is within normal limits. The appendix is not visualized; there is no evidence for appendicitis. Diffuse diverticulosis is noted along  the sigmoid colon, without evidence of diverticulitis. Lymphatic: No retroperitoneal or pelvic sidewall lymphadenopathy is seen. Reproductive: Mild soft tissue inflammation about the bladder could reflect cystitis. The patient is status post hysterectomy. No suspicious adnexal masses are seen. Other: Prominent varices are seen tracking about the vagina and within the perineal soft tissues, extending to the labia. Musculoskeletal: No acute osseous abnormalities are identified. Chronic compression deformities are seen at L1 and L3. The visualized musculature is unremarkable in appearance. Diffuse soft tissue edema is seen along the lower legs and feet bilaterally. No knee joint effusions are seen. There is mild atrophy of the distal thigh musculature bilaterally. IMPRESSION: VASCULAR 1. Acute occlusion of the distal abdominal aorta and common iliac arteries, from just below the level of the origin of the inferior mesenteric artery, to the bifurcations of the common iliac arteries bilaterally. There is reconstitution of the external and internal iliac arteries  bilaterally, likely secondary to collateral vessels along the abdominal wall and within the peritoneum. 2. Occlusion of one of the branches of the superior mesenteric artery, though it reconstitutes distally. Vague surrounding mesenteric edema seen. No definite evidence for ischemic bowel at this time. This is likely relatively acute in nature. 3. Runoff to the lower extremities is not well assessed due to more proximal occlusion. There is perhaps 1-vessel runoff to the level of the right calf, and two-vessel runoff to the level of the left calf. 4. Minimal narrowing suggested along the common femoral arteries bilaterally. The superficial femoral arteries are difficult to fully assess. 5. Slight narrowing of the celiac trunk. 6. Scattered aortic atherosclerosis. 7. Prominent varices noted tracking about the vagina and within the perineal soft tissues, extending  to the labia. NON-VASCULAR 1. Marked enlargement of the right side of the heart, particularly at the right atrium, concerning for right heart insufficiency. Reflux of contrast into the hepatic veins and IVC. 2. Mild soft tissue inflammation about the bladder could reflect cystitis. 3. Small mildly loculated bilateral pleural effusions, with underlying atelectasis. 4. Diffuse soft tissue edema along the lower legs and feet bilaterally. 5. Cholelithiasis.  Gallbladder otherwise unremarkable. 6. 2.4 cm nonspecific hypodensity at the medial right hepatic lobe. 7. Right renal cysts noted. 8. Diffuse diverticulosis along the sigmoid colon, without evidence of diverticulitis. 9. Chronic compression deformities at L1 and L3. 10. Mild atrophy of the distal thigh musculature bilaterally. Critical Value/emergent results were called by telephone at the time of interpretation on 02/21/2017 at 8:57 pm to Dr. Conni Slipper, who verbally acknowledged these results. Electronically Signed   By: Garald Balding M.D.   On: 02/16/2017 21:32    ____________________________________________   PROCEDURES Procedures CRITICAL CARE Performed by: Joni Fears, Zula Hovsepian   Total critical care time: 35 minutes  Critical care time was exclusive of separately billable procedures and treating other patients.  Critical care was necessary to treat or prevent imminent or life-threatening deterioration.  Critical care was time spent personally by me on the following activities: development of treatment plan with patient and/or surrogate as well as nursing, discussions with consultants, evaluation of patient's response to treatment, examination of patient, obtaining history from patient or surrogate, ordering and performing treatments and interventions, ordering and review of laboratory studies, ordering and review of radiographic studies, pulse oximetry and re-evaluation of patient's  condition.  ____________________________________________   INITIAL IMPRESSION / ASSESSMENT AND PLAN / ED COURSE  Pertinent labs & imaging results that were available during my care of the patient were reviewed by me and considered in my medical decision making (see chart for details).  Patient presents with leg pain, coolness, absent pulses. Concern for ischemic limb. CT angiogram. Labs.     Clinical Course as of Feb 18 2226  Sun Feb 18, 2017  2126 D/w Dr. Lucky Cowboy. Rec. Heparin, IVF hydration, plan for angio/ endovascular treatment tomorrow morning.  [PS]    Clinical Course User Index [PS] Carrie Mew, MD     ----------------------------------------- 10:27 PM on 01/31/2017 -----------------------------------------  Case discussed with hospitalist for further management.  ____________________________________________   FINAL CLINICAL IMPRESSION(S) / ED DIAGNOSES  Final diagnoses:  Right leg pain  Absent foot pulse  Aortic occlusion ([place])      New Prescriptions   No medications on file     Portions of this note were generated with dragon dictation software. Dictation errors may occur despite best attempts at proofreading.    Carrie Mew, MD 02/08/2017 2228

## 2017-02-18 NOTE — H&P (Signed)
[place] [facility] [place] NAME: Faith Donovan    MR#:  [id number]  DATE OF BIRTH:  1928/07/06  DATE OF ADMISSION:  01/27/2017  PRIMARY CARE PHYSICIAN: Marguerita Merles, MD   REQUESTING/REFERRING PHYSICIAN: Joni Fears, MD  CHIEF COMPLAINT:   Chief Complaint  Patient presents with  . Leg Pain    HISTORY OF PRESENT ILLNESS:  Faith Donovan  is a 83 y.o. female who presents with leg pain.  Patient had CT angio abdomen and bifem in ED which showed distal aortic occlusion, and SME occlusion.  Pt denies abdominal discomfort.  Vascular surgery contacted and recommends heparin drip IV and they will plan for angiography tomorrow.  Hospitalists called for admission.  PAST MEDICAL HISTORY:   Past Medical History:  Diagnosis Date  . Cancer [facility])    brain tumor  . GERD (gastroesophageal reflux disease)   . High cholesterol   . Hypertension   . Hypothyroidism   . Stroke [facility])     PAST SURGICAL HISTORY:   Past Surgical History:  Procedure Laterality Date  . ABDOMINAL HYSTERECTOMY    . BRAIN SURGERY      SOCIAL HISTORY:   Social History  Substance Use Topics  . Smoking status: Never Smoker  . Smokeless tobacco: Not on file  . Alcohol use No    FAMILY HISTORY:   Family History  Problem Relation Age of Onset  . Cancer Mother   . Stroke Father     DRUG ALLERGIES:   Allergies  Allergen Reactions  . Aspirin Other (See Comments)    Reaction:  Unknown     MEDICATIONS AT HOME:   Prior to Admission medications   Medication Sig Start Date End Date Taking? Authorizing Provider  atenolol (TENORMIN) 50 MG tablet Take 50 mg by mouth 2 (two) times daily.     Historical Provider, MD  furosemide (LASIX) 20 MG tablet Take 20 mg by mouth every other day.    Historical Provider, MD  levothyroxine (SYNTHROID, LEVOTHROID) 50 MCG tablet Take 50 mcg by mouth daily before breakfast.    Historical Provider, MD  lovastatin (MEVACOR) 40 MG tablet  Take 40 mg by mouth every evening.     Historical Provider, MD  omeprazole (PRILOSEC) 20 MG capsule Take 20 mg by mouth daily.    Historical Provider, MD  polyethylene glycol (MIRALAX) packet Take 17 g by mouth daily as needed for mild constipation or moderate constipation. 07/05/15   Orbie Pyo, MD  senna-docusate (SENOKOT-S) 8.6-50 MG per tablet Take 1-2 tablets by mouth 2 (two) times daily.    Historical Provider, MD    REVIEW OF SYSTEMS:  Review of Systems  Constitutional: Negative for chills, fever, malaise/fatigue and weight loss.  HENT: Negative for ear pain, hearing loss and tinnitus.   Eyes: Negative for blurred vision, double vision, pain and redness.  Respiratory: Negative for cough, hemoptysis and shortness of breath.   Cardiovascular: Negative for chest pain, palpitations, orthopnea and leg swelling.  Gastrointestinal: Negative for abdominal pain, constipation, diarrhea, nausea and vomiting.  Genitourinary: Negative for dysuria, frequency and hematuria.  Musculoskeletal: Negative for back pain, joint pain and neck pain.       Leg pain  Skin:       No acne, rash, or lesions  Neurological: Negative for dizziness, tremors, focal weakness and weakness.  Endo/Heme/Allergies: Negative for polydipsia. Does not bruise/bleed easily.  Psychiatric/Behavioral: Negative for depression. The patient is not nervous/anxious and does not have  insomnia.      VITAL SIGNS:   Vitals:   02/15/2017 2045 02/11/2017 2115 01/27/2017 2130 02/06/2017 2145  BP:   (!) 149/100 (!) 115/59  Pulse: 67 82 (!) 54 88  Resp: 14 (!) 33 19 18  Temp:      TempSrc:      SpO2: 99% 94% 95% 97%  Weight:      Height:       Wt Readings from Last 3 Encounters:  02/07/2017 54.4 kg (120 lb)  07/05/15 45 kg (99 lb 3.2 oz)    PHYSICAL EXAMINATION:  Physical Exam  Vitals reviewed. Constitutional: She is oriented to person, place, and time. She appears well-developed and well-nourished. No distress.  HENT:   Head: Normocephalic and atraumatic.  Mouth/Throat: Oropharynx is clear and moist.  Eyes: Conjunctivae and EOM are normal. Pupils are equal, round, and reactive to light. No scleral icterus.  Neck: Normal range of motion. Neck supple. No JVD present. No thyromegaly present.  Cardiovascular: Normal rate and regular rhythm.  Exam reveals no gallop and no friction rub.   No murmur heard. 1+ Right DP pulse, no palpable left DP pulse, both distal extremities cool to touch  Respiratory: Effort normal and breath sounds normal. No respiratory distress. She has no wheezes. She has no rales.  GI: Soft. Bowel sounds are normal. She exhibits no distension. There is no tenderness.  Musculoskeletal: Normal range of motion. She exhibits no edema.  No arthritis, no gout  Lymphadenopathy:    She has no cervical adenopathy.  Neurological: She is alert and oriented to person, place, and time. No cranial nerve deficit.  No dysarthria, no aphasia  Skin: Skin is warm and dry. No rash noted. No erythema.  Psychiatric: She has a normal mood and affect. Her behavior is normal. Judgment and thought content normal.    LABORATORY PANEL:   CBC  Recent Labs Lab 02/09/2017 1937  WBC 6.4  HGB 14.6  HCT 43.3  PLT 180   ------------------------------------------------------------------------------------------------------------------  Chemistries   Recent Labs Lab 02/17/2017 1937  NA 142  K 3.2*  CL 105  CO2 27  GLUCOSE 147*  BUN 24*  CREATININE 1.25*  CALCIUM 8.4*   ------------------------------------------------------------------------------------------------------------------  Cardiac Enzymes No results for input(s): TROPONINI in the last 168 hours. ------------------------------------------------------------------------------------------------------------------  RADIOLOGY:  Ct Angio Ao+bifem W & Or Wo Contrast  Result Date: 02/03/2017 CLINICAL DATA:  Loss of bilateral pedal pulses, with cold  right foot and tibia, and pale skin at the left lower extremity. Initial encounter. EXAM: CT ANGIOGRAPHY OF ABDOMINAL AORTA WITH ILIOFEMORAL RUNOFF TECHNIQUE: Multidetector CT imaging of the abdomen, pelvis and lower extremities was performed using the standard protocol during bolus administration of intravenous contrast. Multiplanar CT image reconstructions and MIPs were obtained to evaluate the vascular anatomy. CONTRAST:  100 mL of Isovue 370 IV contrast COMPARISON:  CT of the abdomen and pelvis performed 07/05/2015 FINDINGS: VASCULAR Aorta: There is acute occlusion of the distal abdominal aorta just below the origin of the mesenteric artery, extending to the bifurcations of the common iliac arteries bilaterally. The external and internal iliac arteries demonstrate reconstitution, likely secondary to collateral vessels along the abdominal wall and within the peritoneum. Scattered calcification is seen along the abdominal aorta and its branches. Celiac: There is slight narrowing along the celiac trunk. SMA: There appears to be occlusion of one of the branches of the superior mesenteric artery, though it reconstitutes distally. Vague surrounding mesenteric edema is seen. No definite ischemic bowel is  seen at this time. Renals: The renal arteries are diminutive but grossly patent bilaterally. IMA: The inferior mesenteric artery remains patent. RIGHT Lower Extremity Inflow: As described above, there is occlusion of the right common iliac artery. There is reconstitution of the right external and internal iliac arteries. Minimal narrowing is suggested along the right common femoral artery. Outflow: The profunda femoris artery appears patent. The superficial femoral artery is only minimally opacified, reflecting more proximal occlusion. Underlying narrowing cannot be excluded. The popliteal artery is grossly intact, though difficult to characterize. Runoff: Runoff to the right ankle is not well characterized due to more  proximal occlusion. Flow is noted within the anterior tibial artery. LEFT Lower Extremity Inflow: As described above, there is occlusion of the left common iliac artery. There is reconstitution of the left external and internal iliac arteries. Minimal narrowing is suggested along the left common femoral artery. Outflow: The profunda femoris artery appears intact. The superficial femoral artery is only minimally opacified, reflecting more proximal collision. Underlying narrowing cannot be excluded. The popliteal artery is not well characterized, though grossly intact. Runoff: Runoff to the left ankle is not well characterized due to more proximal occlusion. There is suggestion of two-vessel runoff to the level of the left calf. Veins: The visualized venous structures are grossly unremarkable, though difficult to fully assess given the phase of contrast enhancement. Review of the MIP images confirms the above findings. NON-VASCULAR Lower chest: Small mildly loculated bilateral pleural effusions are noted. Underlying atelectasis is noted. There is marked enlargement of the right side of the heart, particularly at the right atrium, concerning for right heart insufficiency. There is reflux of contrast into the hepatic veins and IVC. Hepatobiliary: A 2.4 cm hypodensity is noted at the medial right hepatic lobe. The liver is otherwise grossly unremarkable. Small stones are seen within the gallbladder. The gallbladder is otherwise unremarkable. The common bile duct remains normal in caliber. Pancreas: The pancreas is within normal limits. Spleen: The spleen is unremarkable in appearance. Adrenals/Urinary Tract: The adrenal glands are unremarkable in appearance. Right renal cysts are noted. There is no evidence of hydronephrosis. No renal or ureteral stones are identified. No significant perinephric stranding is seen. Stomach/Bowel: The stomach is unremarkable in appearance. The small bowel is within normal limits. The  appendix is not visualized; there is no evidence for appendicitis. Diffuse diverticulosis is noted along the sigmoid colon, without evidence of diverticulitis. Lymphatic: No retroperitoneal or pelvic sidewall lymphadenopathy is seen. Reproductive: Mild soft tissue inflammation about the bladder could reflect cystitis. The patient is status post hysterectomy. No suspicious adnexal masses are seen. Other: Prominent varices are seen tracking about the vagina and within the perineal soft tissues, extending to the labia. Musculoskeletal: No acute osseous abnormalities are identified. Chronic compression deformities are seen at L1 and L3. The visualized musculature is unremarkable in appearance. Diffuse soft tissue edema is seen along the lower legs and feet bilaterally. No knee joint effusions are seen. There is mild atrophy of the distal thigh musculature bilaterally. IMPRESSION: VASCULAR 1. Acute occlusion of the distal abdominal aorta and common iliac arteries, from just below the level of the origin of the inferior mesenteric artery, to the bifurcations of the common iliac arteries bilaterally. There is reconstitution of the external and internal iliac arteries bilaterally, likely secondary to collateral vessels along the abdominal wall and within the peritoneum. 2. Occlusion of one of the branches of the superior mesenteric artery, though it reconstitutes distally. Vague surrounding mesenteric edema seen. No definite  evidence for ischemic bowel at this time. This is likely relatively acute in nature. 3. Runoff to the lower extremities is not well assessed due to more proximal occlusion. There is perhaps 1-vessel runoff to the level of the right calf, and two-vessel runoff to the level of the left calf. 4. Minimal narrowing suggested along the common femoral arteries bilaterally. The superficial femoral arteries are difficult to fully assess. 5. Slight narrowing of the celiac trunk. 6. Scattered aortic  atherosclerosis. 7. Prominent varices noted tracking about the vagina and within the perineal soft tissues, extending to the labia. NON-VASCULAR 1. Marked enlargement of the right side of the heart, particularly at the right atrium, concerning for right heart insufficiency. Reflux of contrast into the hepatic veins and IVC. 2. Mild soft tissue inflammation about the bladder could reflect cystitis. 3. Small mildly loculated bilateral pleural effusions, with underlying atelectasis. 4. Diffuse soft tissue edema along the lower legs and feet bilaterally. 5. Cholelithiasis.  Gallbladder otherwise unremarkable. 6. 2.4 cm nonspecific hypodensity at the medial right hepatic lobe. 7. Right renal cysts noted. 8. Diffuse diverticulosis along the sigmoid colon, without evidence of diverticulitis. 9. Chronic compression deformities at L1 and L3. 10. Mild atrophy of the distal thigh musculature bilaterally. Critical Value/emergent results were called by telephone at the time of interpretation on 01/25/2017 at 8:57 pm to Dr. Conni Slipper, who verbally acknowledged these results. Electronically Signed   By: Garald Balding M.D.   On: 02/21/2017 21:32    EKG:   Orders placed or performed during the hospital encounter of 02/24/2017  . ED EKG  . ED EKG    IMPRESSION AND PLAN:  Principal Problem:   Aortic occlusion (HCC) - likely cause of leg pain, lactic acid elevated, IV fluids and serial lactate tonight, IV heparin, vascular consult with plan for angiography tomorrow Active Problems:   Leg pain - see above   HTN (hypertension) - continue home meds   HLD (hyperlipidemia) - continue home meds   Hypothyroidism - home dose thyroid replacement   GERD (gastroesophageal reflux disease) - home dose PPI  All the records are reviewed and case discussed with ED provider. Management plans discussed with the patient and/or family.  DVT PROPHYLAXIS: Systemic anticoagulation  GI PROPHYLAXIS: PPI  ADMISSION STATUS:  Inpatient  CODE STATUS: Full Code Status History    This patient does not have a recorded code status. Please follow your organizational policy for patients in this situation.      TOTAL TIME TAKING CARE OF THIS PATIENT: 45 minutes.    Shayana Hornstein [place] 02/15/2017, 10:30 PM  [facility]  Office  [phone]  CC: Primary care physician; Marguerita Merles, MD

## 2017-02-18 NOTE — ED Notes (Signed)
Pt repositioned in bed for comfort.

## 2017-02-18 NOTE — ED Notes (Signed)
Family updated regarding ct result turn around.

## 2017-02-18 NOTE — ED Notes (Signed)
Family updated on progression of results, no change in previous assessment.

## 2017-02-18 NOTE — ED Notes (Signed)
Faith Donovan [phone]

## 2017-02-18 NOTE — ED Notes (Signed)
Pt without palpable or dopplerable pulses noted to bilateral pedal. Pt has a dopplerable pulse noted to left popliteal, but none on right. Pt has 2+ palpable femoral pulses bilaterally. Pt has cold right foot, right tibia. Right leg skin from knee down is pale with slight blue tinge. Pt with pale skin noted from left knee to ankle. Pt cries when lower extremities touched. Pt with a fib on monitor. Clear breath sounds in both lung fields. Pt with breakdown noted to sacrum approx 52mm and linear in length, skin is open, no bleeding noted.

## 2017-02-18 NOTE — Progress Notes (Signed)
Patient for Aortogram in AM with Dr. Lucky Cowboy.

## 2017-02-18 NOTE — ED Triage Notes (Signed)
Pt arrived via EMS from home; c/o leg pain; legs cold to touch, right leg more than left; pt lives with son; he says she was fine yesterday and today up until she began to c/o pain about 1 hour pta; up with assistance only; contractures to left side;

## 2017-02-18 NOTE — ED Notes (Signed)
Pt to ct scan.

## 2017-02-18 NOTE — Progress Notes (Signed)
ANTICOAGULATION CONSULT NOTE - Initial Consult  Pharmacy Consult for heparin drip  Indication: acute aortic / iliac occlusion. arterial thrombosis  Allergies  Allergen Reactions  . Aspirin Other (See Comments)    Reaction:  Unknown     Patient Measurements: Height: 5\' 2"  (157.5 cm) Weight: 120 lb (54.4 kg) IBW/kg (Calculated) : 50.1  Vital Signs: Temp: 97.9 F (36.6 C) (03/25 1918) Temp Source: Axillary (03/25 1918) BP: 146/101 (03/25 1918) Pulse Rate: 82 (03/25 2115)  Labs:  Recent Labs  02/12/2017 1937  HGB 14.6  HCT 43.3  PLT 180  APTT 28  LABPROT 14.7  INR 1.14  CREATININE 1.25*    Estimated Creatinine Clearance: 25.6 mL/min (A) (by C-G formula based on SCr of 1.25 mg/dL (H)).   Medical History: Past Medical History:  Diagnosis Date  . Cancer [facility])    brain tumor  . High cholesterol   . Hypertension   . Stroke [facility])    Assessment: 83 yo female with acute aortic / iliac occlusion/ arterial thrombosis. Pharmacy consulted for heparin drip dosing and monitoring.   Goal of Therapy:  Heparin level 0.3-0.7 units/ml Monitor platelets by anticoagulation protocol: Yes   Plan:  Baseline line labs ordered Give 3000 units bolus x 1 Start heparin infusion at 750 units/hr Check anti-Xa level in 8 hours and daily while on heparin Continue to monitor H&H and platelets  Pernell Dupre, PharmD, BCPS Clinical Pharmacist 02/03/2017 9:28 PM

## 2017-02-19 ENCOUNTER — Encounter: Admission: EM | Disposition: E | Payer: Self-pay | Source: Home / Self Care | Attending: Internal Medicine

## 2017-02-19 DIAGNOSIS — M79604 Pain in right leg: Secondary | ICD-10-CM

## 2017-02-19 DIAGNOSIS — I70223 Atherosclerosis of native arteries of extremities with rest pain, bilateral legs: Secondary | ICD-10-CM

## 2017-02-19 DIAGNOSIS — I10 Essential (primary) hypertension: Secondary | ICD-10-CM

## 2017-02-19 DIAGNOSIS — N189 Chronic kidney disease, unspecified: Secondary | ICD-10-CM

## 2017-02-19 DIAGNOSIS — I7409 Other arterial embolism and thrombosis of abdominal aorta: Secondary | ICD-10-CM

## 2017-02-19 DIAGNOSIS — I700 Atherosclerosis of aorta: Secondary | ICD-10-CM

## 2017-02-19 DIAGNOSIS — M79605 Pain in left leg: Secondary | ICD-10-CM

## 2017-02-19 HISTORY — PX: AORTIC INTERVENTION: CATH118225

## 2017-02-19 HISTORY — PX: ABDOMINAL AORTOGRAM: CATH118222

## 2017-02-19 HISTORY — PX: LOWER EXTREMITY ANGIOGRAPHY: CATH118251

## 2017-02-19 LAB — BLOOD GAS, ARTERIAL
Acid-base deficit: 17.6 mmol/L — ABNORMAL HIGH (ref 0.0–2.0)
Bicarbonate: 9.6 mmol/L — ABNORMAL LOW (ref 20.0–28.0)
FIO2: 100
O2 Saturation: 99.5 %
PATIENT TEMPERATURE: 37
PH ART: 7.16 — AB (ref 7.350–7.450)
pCO2 arterial: 27 mmHg — ABNORMAL LOW (ref 32.0–48.0)
pO2, Arterial: 206 mmHg — ABNORMAL HIGH (ref 83.0–108.0)

## 2017-02-19 LAB — BASIC METABOLIC PANEL
ANION GAP: 9 (ref 5–15)
ANION GAP: 15 (ref 5–15)
BUN: 21 mg/dL — ABNORMAL HIGH (ref 6–20)
BUN: 24 mg/dL — ABNORMAL HIGH (ref 6–20)
CHLORIDE: 111 mmol/L (ref 101–111)
CO2: 27 mmol/L (ref 22–32)
CO2: 20 mmol/L — ABNORMAL LOW (ref 22–32)
Calcium: 8.1 mg/dL — ABNORMAL LOW (ref 8.9–10.3)
Calcium: 7.8 mg/dL — ABNORMAL LOW (ref 8.9–10.3)
Chloride: 108 mmol/L (ref 101–111)
Creatinine, Ser: 1.04 mg/dL — ABNORMAL HIGH (ref 0.44–1.00)
Creatinine, Ser: 1.35 mg/dL — ABNORMAL HIGH (ref 0.44–1.00)
GFR calc Af Amer: 55 mL/min — ABNORMAL LOW (ref >60)
GFR, EST AFRICAN AMERICAN: 40 mL/min — AB (ref >60)
GFR, EST NON AFRICAN AMERICAN: 47 mL/min — AB (ref >60)
GFR, EST NON AFRICAN AMERICAN: 34 mL/min — AB (ref >60)
Glucose, Bld: 115 mg/dL — ABNORMAL HIGH (ref 65–99)
Glucose, Bld: 154 mg/dL — ABNORMAL HIGH (ref 65–99)
POTASSIUM: 3.6 mmol/L (ref 3.5–5.1)
POTASSIUM: 3.7 mmol/L (ref 3.5–5.1)
SODIUM: 144 mmol/L (ref 135–145)
SODIUM: 146 mmol/L — AB (ref 135–145)

## 2017-02-19 LAB — CBC
HCT: 41.5 % (ref 35.0–47.0)
HEMOGLOBIN: 13.9 g/dL (ref 12.0–16.0)
MCH: 34.2 pg — AB (ref 26.0–34.0)
MCHC: 33.4 g/dL (ref 32.0–36.0)
MCV: 102.4 fL — ABNORMAL HIGH (ref 80.0–100.0)
Platelets: 148 10*3/uL — ABNORMAL LOW (ref 150–440)
RBC: 4.05 MIL/uL (ref 3.80–5.20)
RDW: 17.2 % — ABNORMAL HIGH (ref 11.5–14.5)
WBC: 7.1 10*3/uL (ref 3.6–11.0)

## 2017-02-19 LAB — LACTIC ACID, PLASMA
LACTIC ACID, VENOUS: 3 mmol/L — AB (ref 0.5–1.9)
Lactic Acid, Venous: 6.3 mmol/L (ref 0.5–1.9)

## 2017-02-19 LAB — GLUCOSE, CAPILLARY: GLUCOSE-CAPILLARY: 114 mg/dL — AB (ref 65–99)

## 2017-02-19 LAB — SURGICAL PCR SCREEN
MRSA, PCR: NEGATIVE
Staphylococcus aureus: POSITIVE — AB

## 2017-02-19 LAB — HEPARIN LEVEL (UNFRACTIONATED): Heparin Unfractionated: 1 IU/mL — ABNORMAL HIGH (ref 0.30–0.70)

## 2017-02-19 SURGERY — ABDOMINAL AORTOGRAM
Anesthesia: Moderate Sedation

## 2017-02-19 SURGERY — LOWER EXTREMITY ANGIOGRAPHY
Anesthesia: Moderate Sedation | Laterality: Bilateral

## 2017-02-19 MED ORDER — HEPARIN (PORCINE) IN NACL 100-0.45 UNIT/ML-% IJ SOLN
550.0000 [IU]/h | INTRAMUSCULAR | Status: DC
  Administered 2017-02-19 (×2): 550 [IU]/h via INTRAVENOUS

## 2017-02-19 MED ORDER — IOPAMIDOL (ISOVUE-300) INJECTION 61%
INTRAVENOUS | Status: DC | PRN
  Administered 2017-02-19: 15 mL via INTRAVENOUS

## 2017-02-19 MED ORDER — FENTANYL CITRATE (PF) 100 MCG/2ML IJ SOLN
INTRAMUSCULAR | Status: DC | PRN
  Administered 2017-02-19: 25 ug via INTRAVENOUS

## 2017-02-19 MED ORDER — HEPARIN (PORCINE) IN NACL 100-0.45 UNIT/ML-% IJ SOLN
INTRAMUSCULAR | Status: AC
  Administered 2017-02-19: 300 [IU]/h via INTRAVENOUS
  Filled 2017-02-19: qty 250

## 2017-02-19 MED ORDER — HEPARIN (PORCINE) IN NACL 2-0.9 UNIT/ML-% IJ SOLN
INTRAMUSCULAR | Status: AC
  Filled 2017-02-19: qty 1000

## 2017-02-19 MED ORDER — HEPARIN (PORCINE) IN NACL 100-0.45 UNIT/ML-% IJ SOLN
300.0000 [IU]/h | INTRAMUSCULAR | Status: DC
  Administered 2017-02-19: 300 [IU]/h via INTRAVENOUS

## 2017-02-19 MED ORDER — HEPARIN SODIUM (PORCINE) 1000 UNIT/ML IJ SOLN
INTRAMUSCULAR | Status: AC
  Filled 2017-02-19: qty 1

## 2017-02-19 MED ORDER — HYDROMORPHONE HCL 1 MG/ML IJ SOLN
0.5000 mg | Freq: Once | INTRAMUSCULAR | Status: AC
  Administered 2017-02-19: 0.5 mg via INTRAVENOUS

## 2017-02-19 MED ORDER — SODIUM CHLORIDE 0.9 % IV SOLN: Freq: Once | INTRAVENOUS | Status: DC

## 2017-02-19 MED ORDER — MIDAZOLAM HCL 5 MG/5ML IJ SOLN
INTRAMUSCULAR | Status: AC
  Filled 2017-02-19: qty 5

## 2017-02-19 MED ORDER — LIDOCAINE-EPINEPHRINE (PF) 2 %-1:200000 IJ SOLN
INTRAMUSCULAR | Status: AC
  Filled 2017-02-19: qty 20

## 2017-02-19 MED ORDER — HYDROMORPHONE HCL 1 MG/ML IJ SOLN
INTRAMUSCULAR | Status: AC
  Filled 2017-02-19: qty 0.5

## 2017-02-19 MED ORDER — IOPAMIDOL (ISOVUE-300) INJECTION 61%
INTRAVENOUS | Status: DC | PRN
  Administered 2017-02-19: 85 mL via INTRA_ARTERIAL

## 2017-02-19 MED ORDER — MIDAZOLAM HCL 2 MG/2ML IJ SOLN
INTRAMUSCULAR | Status: DC | PRN
  Administered 2017-02-19: 1 mg via INTRAVENOUS

## 2017-02-19 MED ORDER — SODIUM CHLORIDE 0.9 % IV SOLN
0.5000 mg/h | INTRAVENOUS | Status: AC
  Administered 2017-02-19: 0.5 mg/h
  Filled 2017-02-19: qty 10

## 2017-02-19 MED ORDER — HEPARIN SODIUM (PORCINE) 1000 UNIT/ML IJ SOLN
INTRAMUSCULAR | Status: DC | PRN
  Administered 2017-02-19: 3000 [IU] via INTRAVENOUS

## 2017-02-19 MED ORDER — ALTEPLASE 2 MG IJ SOLR
INTRAMUSCULAR | Status: DC | PRN
  Administered 2017-02-19: 2 mg

## 2017-02-19 MED ORDER — METOPROLOL TARTRATE 5 MG/5ML IV SOLN: 5.0000 mg | Freq: Four times a day (QID) | INTRAVENOUS | Status: DC

## 2017-02-19 MED ORDER — MIDAZOLAM HCL 2 MG/2ML IJ SOLN
INTRAMUSCULAR | Status: DC | PRN
Start: 2017-02-19 — End: 2017-02-19
  Administered 2017-02-19: 0.5 mg via INTRAVENOUS

## 2017-02-19 MED ORDER — HEPARIN SODIUM (PORCINE) 1000 UNIT/ML IJ SOLN
INTRAMUSCULAR | Status: DC | PRN
Start: 2017-02-19 — End: 2017-02-19
  Administered 2017-02-19: 3000 [IU] via INTRAVENOUS

## 2017-02-19 MED ORDER — FENTANYL CITRATE (PF) 100 MCG/2ML IJ SOLN
INTRAMUSCULAR | Status: AC
Start: 2017-02-19 — End: 2017-02-19
  Filled 2017-02-19: qty 2

## 2017-02-19 MED ORDER — HEPARIN (PORCINE) IN NACL 100-0.45 UNIT/ML-% IJ SOLN
550.0000 [IU]/h | INTRAMUSCULAR | Status: DC
  Administered 2017-02-19: 550 [IU]/h via INTRAVENOUS

## 2017-02-19 MED ORDER — HEPARIN SODIUM (PORCINE) 1000 UNIT/ML IJ SOLN
INTRAMUSCULAR | Status: AC
Start: 2017-02-19 — End: 2017-02-19
  Filled 2017-02-19: qty 1

## 2017-02-19 MED ORDER — METOPROLOL TARTRATE 5 MG/5ML IV SOLN
5.0000 mg | Freq: Four times a day (QID) | INTRAVENOUS | Status: DC
  Administered 2017-02-20 – 2017-02-21 (×4): 5 mg via INTRAVENOUS
  Filled 2017-02-19 (×5): qty 5

## 2017-02-19 MED ORDER — HEPARIN (PORCINE) IN NACL 100-0.45 UNIT/ML-% IJ SOLN
INTRAMUSCULAR | Status: AC
  Filled 2017-02-19: qty 250

## 2017-02-19 MED ORDER — FENTANYL CITRATE (PF) 100 MCG/2ML IJ SOLN
INTRAMUSCULAR | Status: AC
  Filled 2017-02-19: qty 2

## 2017-02-19 SURGICAL SUPPLY — 14 items
CATH 5FR PIG 90CM (CATHETERS) ×3 IMPLANT
CATH BEACON 5.038 65CM KMP-01 (CATHETERS) ×3 IMPLANT
CATH INFUS 90CMX20CM (CATHETERS) ×6 IMPLANT
COVER PROBE U/S 5X48 (MISCELLANEOUS) ×3 IMPLANT
DEVICE PRESTO INFLATION (MISCELLANEOUS) ×3 IMPLANT
DEVICE SOLENT PROXI 90CM (CATHETERS) ×3 IMPLANT
GLIDEWIRE ADV .035X260CM (WIRE) ×3 IMPLANT
PACK ANGIOGRAPHY (CUSTOM PROCEDURE TRAY) ×3 IMPLANT
SHEATH BRITE TIP 5FRX11 (SHEATH) ×6 IMPLANT
SHEATH BRITE TIP 6FRX11 (SHEATH) ×6 IMPLANT
SYR MEDRAD MARK V 150ML (SYRINGE) ×3 IMPLANT
TUBING CONTRAST HIGH PRESS 72 (TUBING) ×3 IMPLANT
WIRE J 3MM .035X145CM (WIRE) ×3 IMPLANT
WIRE MAGIC TORQUE 260C (WIRE) ×3 IMPLANT

## 2017-02-19 SURGICAL SUPPLY — 15 items
BALLN LUTONIX DCB 5X60X130 (BALLOONS) ×6
BALLOON LUTONIX DCB 5X60X130 (BALLOONS) ×2 IMPLANT
CANISTER PENUMBRA MAX (MISCELLANEOUS) ×3 IMPLANT
CATH ACCU-VU SIZ PIG 5F 70CM (CATHETERS) ×3 IMPLANT
CATH INDIGO 6 ST-TIP 135CM (CATHETERS) ×3 IMPLANT
DEVICE PRESTO INFLATION (MISCELLANEOUS) ×3 IMPLANT
DEVICE STARCLOSE SE CLOSURE (Vascular Products) ×6 IMPLANT
PACK ANGIOGRAPHY (CUSTOM PROCEDURE TRAY) ×3 IMPLANT
SHEATH BRITE TIP 7FRX11 (SHEATH) ×6 IMPLANT
SHEATH PINNACLE MP 7F 45CM (SHEATH) ×3 IMPLANT
SYR MEDRAD MARK V 150ML (SYRINGE) ×3 IMPLANT
TUBING ASPIRATION INDIGO (MISCELLANEOUS) ×3 IMPLANT
TUBING CONTRAST HIGH PRESS 72 (TUBING) ×3 IMPLANT
WIRE MAGIC TOR.035 180C (WIRE) ×6 IMPLANT
WIRE MAGIC TORQUE 260C (WIRE) ×3 IMPLANT

## 2017-02-19 NOTE — Progress Notes (Signed)
ANTICOAGULATION CONSULT NOTE - Initial Consult  Pharmacy Consult for heparin drip  Indication: acute aortic / iliac occlusion. arterial thrombosis  Allergies  Allergen Reactions  . Aspirin Other (See Comments)    Reaction:  Unknown     Patient Measurements: Height: 5\' 5"  (165.1 cm) Weight: 126 lb (57.2 kg) IBW/kg (Calculated) : 57  Vital Signs: BP: 119/99 (03/26 2022) Pulse Rate: 112 (03/26 1945)  Labs:  Recent Labs  01/30/2017 1937 02/16/2017 0534  HGB 14.6 13.9  HCT 43.3 41.5  PLT 180 148*  APTT 28  --   LABPROT 14.7  --   INR 1.14  --   HEPARINUNFRC  --  1.00*  CREATININE 1.25* 1.04*    Estimated Creatinine Clearance: 34.9 mL/min (A) (by C-G formula based on SCr of 1.04 mg/dL (H)).   Medical History: Past Medical History:  Diagnosis Date  . Cancer [facility])    brain tumor  . GERD (gastroesophageal reflux disease)   . High cholesterol   . Hypertension   . Hypothyroidism   . Stroke [facility])    Assessment: 83 yo female with acute aortic / iliac occlusion/ arterial thrombosis. Patient has acute BLE ischemia with aortoiliac occlusion. Pharmacy consulted to assist with heparin drip dosing and monitoring.  Patient underwent angiogram this morning with catheter directed thrombolytic therapy with TPA and placement of infusion catheters for continued thrombolytic therapy. Patient had heparin 300 units/hr running intra-arterial with catheters and then went for repeat angiogram this evening.   Spoke with vascular surgeon post-op. Alteplase and heparin 300 units/hr orders discontinued per discussion with MD. Patient is to be resumed on heparin 550 units/hr now and will check HL in 6 hours with a goal of 0.3 - 0.5 (lower end of therapeutic range).  Goal of Therapy:  Heparin level 0.3-0.5 units/ml Monitor platelets by anticoagulation protocol: Yes   Plan:  Entered order to resume heparin infusion at 550 units/hr. Will order HL in 6 hours and CBC with AM labs tomorrow.   Lenis Noon, PharmD, BCPS Clinical Pharmacist 02/18/2017 8:31 PM

## 2017-02-19 NOTE — Progress Notes (Signed)
Dr.Dr.Dew called  And recommended icu monitor overnight. Patient is oxygenating well but pH is 7.1 due to metabolic acidosis. Recommended continuing heparin, IV hydration, patient had thrombolytic therapy with reperfusion in aortic iliac area. Continue heparin drip, IV fluids for now. And monitor in ICU closely. CODE STATUS is DO NOT RESUSCITATE.

## 2017-02-19 NOTE — Progress Notes (Signed)
Patient's sat continues to read in low 60's. Patient on CPAP.  MD aware. Blood gas ordered.

## 2017-02-19 NOTE — Plan of Care (Signed)
Problem: Safety: Goal: Ability to remain free from injury will improve Outcome: Progressing Educated the patient on importance of hand hygiene to prevent infection

## 2017-02-19 NOTE — Progress Notes (Signed)
Spoke with son regarding consent for procedure. According to son, no one has contacted him regarding this procedure. He wants to speak with the MD before proceeding. This message has been relayed to vascular team. There is also no attestation for this patient.

## 2017-02-19 NOTE — Progress Notes (Signed)
Patient still in procedure for repeat angiogram - pharmacy to continue to follow for heparin plan post-op.  Lenis Noon, PharmD 01/29/2017 7:11 PM

## 2017-02-19 NOTE — Op Note (Signed)
[facility] Percutaneous Study/Intervention Procedural Note   Date of Surgery:  01/30/2017  Surgeon(s):Linette Gunderson   Assistants:none  Pre-operative Diagnosis: acute BLE ischemia with aortoiliac occlusion  Post-operative diagnosis: Same  Procedure(s) Performed: 1. Ultrasound guidance for vascular access bilateral femoral arteries 2. Catheter placement into aorta from bilateral femoral approaches 3. Aortogram and ileofemoral angiogram angiogram 4. Catheter directed thrombolytic therapy to the aorta and bilateral common iliac arteries using 8 mg of TPA delivered with the AngioJet Proxy catheter 5. Placement of infusion catheters for continued thrombolytic therapy to the aorta and iliac arteries bilaterally using a 90 cm length total 20 cm working length lytic catheters from each side    EBL: minimal  Contrast: 15 cc  Fluoro Time: 2 minutes  Moderate Conscious Sedation Time: approximately 20 minutes using 0.5 mg of Versed and 25 mcg of Fentanyl  Indications: Patient is an 83 y.o.female with acute aortoiliac occlusion likely from embolic disease but also with some chronic disease present based on a suboptimal CT scan. The patient is brought in for angiography for further evaluation and potential treatment. Risks and benefits are discussed and informed consent is obtained  Procedure: The patient was identified and appropriate procedural time out was performed. The patient was then placed supine on the table and prepped and draped in the usual sterile fashion.Moderate conscious sedation was administered during a face to face encounter with the patient throughout the procedure with my supervision of the RN administering medicines and monitoring the patient's vital signs, pulse oximetry, telemetry and mental status throughout from the start of the procedure until the patient was taken to  the recovery room. Ultrasound was used to evaluate the right and then the left common femoral artery. They were patent . A digital ultrasound image was acquired. A Seldinger needle was used to access the left common femoral artery under direct ultrasound guidance and a permanent image was performed.Similarly, a Seldinger needle was used to access the right common femoral artery under direct ultrasound guidance and a permanent image was performed. A 0.035 J wire was advanced without resistance and a 5Fr sheath was placed on each side. Pigtail catheter was placed into the aorta and an AP aortogram was performed. This demonstrated that the renal arteries appear patent with mild disease. Aorta is abruptly occluded just below the IMA consistent with embolic phenomenon. Both common iliac arteries are occluded which also appears to be thrombus. The external iliac arteries reconstitute and do not have high-grade stenosis in either external iliac artery. I then gave the patient 3000 units of intravenous heparin and cross the occlusion without difficulty from both femoral approaches confirming intraluminal flow in the aorta. Wires were replaced and the AngioJet proxy catheter was used to deliver 8 mg of TPA and a power pulse spray fashion to both common iliac arteries and into the aorta from both femoral approaches. The bulk of thrombus was large and I felt she would need continued thrombolytic therapy, and so rather than trying to debulk this all at one procedure, I elected to place thrombolytic catheters and will perform continuous infusion of TPA with a planned repeat angiogram later today. 90 cm total length 20 cm working length catheters were parked into the proximal to mid infrarenal aorta and down to the proximal external iliac arteries from both femoral approaches. These were secured with silk sutures. The patient was taken to the recovery room in stable condition having tolerated the procedure well.  Findings:   Aortogram: Renal arteries appear  patent with mild disease. Aorta is abruptly occluded just below the IMA consistent with embolic phenomenon. Both common iliac arteries are occluded which also appears to be thrombus. The external iliac arteries reconstitute and do not have high-grade stenosis in either external iliac artery.    Disposition: Patient was taken to the recovery room in stable condition having tolerated the procedure well.  Complications: None  Leotis Pain 02/23/2017 11:54 AM   This note was created with Dragon Medical transcription system. Any errors in dictation are purely unintentional.

## 2017-02-19 NOTE — H&P (Signed)
[facility] History & Physical Update  The patient was interviewed and re-examined.  The patient's previous History and Physical has been reviewed and is unchanged.  There is no change in the plan of care. We plan to proceed with the scheduled procedure of a second look angiogram after thrombolysis.  Leotis Pain, MD  02/06/2017, 5:52 PM

## 2017-02-19 NOTE — Op Note (Signed)
[facility] Percutaneous Study/Intervention Procedural Note   Date of Surgery:  02/14/2017  Surgeon(s):Biviana Saddler   Assistants:none  Pre-operative Diagnosis: Acute ischemia to the lower extremities bilaterally with rest pain  Post-operative diagnosis: Same  Procedure(s) Performed: 1. Removal of previous thrombolytic catheters. 2. Catheter placement into left popliteal artery from right femoral approach and into right superficial femoral artery from left femoral approach 3. Aortogram and selective bilateral lower extremity angiograms 4. Mechanical thrombectomy with the penumbra cat 6 device to bilateral common iliac arteries, bilateral common femoral arteries, bilateral superficial femoral arteries, and the left popliteal artery 5. Percutaneous transluminal angioplasty of the right common femoral artery and proximal superficial femoral artery with 5 mm diameter by 6 cm length Lutonix drug-coated angioplasty balloon  6.  Percutaneous transluminal angioplasty of the left common femoral artery and proximal superficial femoral artery with 5 mm diameter by 6 cm length Lutonix drug-coated angioplasty balloon 7. StarClose closure device bilateral femoral arteries  EBL: 400 cc  Contrast: 85 cc  Fluoro Time: 7.2 minutes  Moderate Conscious Sedation Time: approximately 75 minutes using 1 mg of Versed and 25 mcg of Fentanyl  Indications: Patient is a 83 y.o.female with acute ischemia of both lower extremities. She was known to have an aortoiliac occlusion and has been getting thrombolytic therapy through catheter directed lysis throughout the day. The patient is brought in for angiography for further evaluation and potential treatment. Risks and benefits are discussed and informed consent is obtained  Procedure: The patient was identified and appropriate procedural time out was  performed. The patient was then placed supine on the table and prepped and draped in the usual sterile fashion.Moderate conscious sedation was administered during a face to face encounter with the patient throughout the procedure with my supervision of the RN administering medicines and monitoring the patient's vital signs, pulse oximetry, telemetry and mental status throughout from the start of the procedure until the patient was taken to the recovery room. The existing thrombolytic catheters were removed over a Magic torque wire and 7 French sheaths were placed bilaterally. Pigtail catheter was placed into the aorta and an AP aortogram was performed. This demonstrated the previously seen aorta and proximal iliac artery thrombosis had resolved and there is no significant residual stenosis, but there was now thrombosis of both external iliac arteries with essentially no flow seen to the lower extremities. The renal arteries appeared patent still. I then crossed the aortic bifurcation and advanced to the right femoral head from the left femoral approach. Selective right lower extremity angiogram was then performed. This demonstrated thrombosis of the common femoral artery and the SFA and profunda femoris artery with essentially no flow distally. The patient was systemically heparinized and a 7 French up and over sheath was placed from the left femoral approach. The penumbra cat 6 device was used to perform mechanical thrombectomy in the right leg initially. This encompassed the external iliac artery, common femoral artery, and the superficial femoral artery to the mid to distal segment. Several passes were made with significant return of both blood and thrombus until there appeared to be a channel with continuous blood flow. Imaging showed a thrombus that remained in the common femoral artery and the proximal SFA with resolution of the thrombus in the external iliac artery with no stenosis in the external iliac  artery. The thrombus in the common femoral artery and proximal SFA was nearly occlusive. I selected a 5 mm diameter by 6 cm length Lutonix drug-coated angioplasty  balloon and treated the right common femoral artery and proximal SFA. The balloon was inflated to 12 atm for 1 minute. The left femoral sheath was removed while this was performed. Angiogram following this showed markedly improved flow and no significant residual thrombus or stenosis in the common femoral artery or proximal SFA. There appeared to be thrombus in the tibioperoneal trunk and no flow in the peroneal or posterior tibial arteries but there was now flow through an anterior tibial artery that appeared continuous to the foot although her vessels were quite small. At this point, I felt we had improved her right lower extremity flow was much as possible. The right femoral sheath was replaced. StarClose closure device was performed from the left femoral sheath after it was removed with excellent hemostatic result. I then turned my attention to improving the perfusion in her left leg. Similarly, across the aortic bifurcation without difficulty with the pigtail catheter and a Magic torque wire. An Up & Over 7 French sheath was placed from the right femoral to the left common femoral artery. The penumbra cat 6 device was then used for mechanical thrombectomy in the left external iliac artery, left common femoral artery, and initially the left superficial femoral artery. After several passes, there were still very poor flow with a and area of thrombus that was occlusive in the common femoral artery and proximal SFA as well as the profunda femoris artery. I elected to treat this area with angioplasty as well. A 5 mm diameter by 6 cm length Lutonix drug-coated angioplasty balloon was used to treat the left common femoral artery and proximal SFA much like the right. This was inflated to 10 atm for 1 minute. Completion angiogram showed resolution of this with  persistent thrombus in the profunda femoris artery that I did not feel we can improve much on. On the left however there remained sluggish flow with thrombus in the distal SFA and popliteal artery. Another pass was performed with the penumbra cat 6 device down into the left SFA and popliteal arteries. This was passed until there appeared to be continuous flow in the popliteal and SFA. The catheter was removed and imaging was performed which now showed what appeared to be two-vessel runoff through the anterior tibial artery and peroneal artery with some degree of thrombosis in the tibial vessels, but the flow was much more brisk. I felt this was pretty much all we could do from an endovascular standpoint to improve the perfusion in this extremely ill and debilitated patient. I elected to terminate the procedure. The right femoral sheath was removed and StarClose closure device was deployed in the right femoral artery with excellent hemostatic result. The patient was taken to the recovery room in stable condition having tolerated the procedure well.  Findings:  Aortogram: The previously seen aorta and proximal iliac artery thrombosis had resolved and there is no significant residual stenosis, but there was now thrombosis of both external iliac arteries with essentially no flow seen to the lower extremities. The renal arteries appeared patent still. Bilateral Lower Extremity: Thrombus in the common femoral arteries and proximal SFA and profunda femoris arteries with very poor flow distally in both lower extremities.   Disposition: Patient was taken to the recovery room in stable condition having tolerated the procedure well.  Complications: None  Leotis Pain 02/03/2017 7:26 PM   This note was created with Dragon Medical transcription system. Any errors in dictation are purely unintentional.

## 2017-02-19 NOTE — Progress Notes (Signed)
[place] at [facility]                                                                                                                                                                                  Patient Demographics   Faith Donovan, is a 83 y.o. female, DOB - 1927/12/31, LYY:[id number]  Admit date - 02/10/2017   Admitting Physician Lance Coon, MD  Outpatient Primary MD for the patient is Marguerita Merles, MD   LOS - 1  Subjective: Patient underwent catheter directed thrombolytic therapy Currently confused and unable to provide any review of systems seen postop   Review of Systems:   CONSTITUTIONAL: unable to provide  Vitals:   Vitals:   01/25/2017 1230 01/29/2017 1245 01/29/2017 1300 02/16/2017 1330  BP: (!) 130/101 (!) 151/106 (!) 142/100 (!) 133/99  Pulse: 90 (!) 110  96  Resp: 17 17 18  (!) 21  Temp:      TempSrc:      SpO2: 93% 98%  95%  Weight:      Height:        Wt Readings from Last 3 Encounters:  02/07/2017 126 lb (57.2 kg)  07/05/15 99 lb 3.2 oz (45 kg)     Intake/Output Summary (Last 24 hours) at 01/29/2017 1421 Last data filed at 02/18/2017 0510  Gross per 24 hour  Intake          1438.35 ml  Output                0 ml  Net          1438.35 ml    Physical Exam:   GENERAL: uncomfortable due to pain HEAD, EYES, EARS, NOSE AND THROAT: Atraumatic, normocephalic. E  Pupils equal and reactive to light. Sclerae anicteric. No conjunctival injection. No oro-pharyngeal erythema.  NECK: Supple. There is no jugular venous distention. No bruits, no lymphadenopathy, no thyromegaly.  HEART:irregulr irregular  . No murmurs, no rubs, no clicks.  LUNGS: Clear to auscultation bilaterally. No rales or rhonchi. No wheezes.  ABDOMEN: Soft, flat, nontender, nondistended. Has good bowel sounds. No hepatosplenomegaly appreciated.  EXTREMITIES: No evidence of any cyanosis, clubbing, or peripheral edema.  +2 pedal and radial pulses bilaterally.  NEUROLOGIC:  confused SKIN: Moist and warm with no rashes appreciated.  Psych:confused LN: No inguinal LN enlargement    Antibiotics   Anti-infectives    Start     Dose/Rate Route Frequency Ordered Stop   02/18/2017 0000  ceFAZolin (ANCEF) IVPB 1 g/50 mL premix    Comments:  Send with pt to OR   1 g 100 mL/hr over 30 Minutes Intravenous  Once 02/18/17 2358 02/24/2017  1140      Medications   Scheduled Meds: . [MAR Hold] atenolol  50 mg Oral BID  . [MAR Hold] levothyroxine  50 mcg Oral QAC breakfast  . [MAR Hold] pantoprazole  40 mg Oral Daily  . [MAR Hold] pravastatin  40 mg Oral q1800  . [MAR Hold] sodium chloride flush  3 mL Intravenous Q12H   Continuous Infusions: . sodium chloride    . alteplase (LIMB ISCHEMIA) 10 mg in normal saline (0.02 mg/mL) infusion 0.5 mg/hr (02/17/2017 1204)  . alteplase (LIMB ISCHEMIA) 10 mg in normal saline (0.02 mg/mL) infusion 0.5 mg/hr (02/17/2017 1203)  . heparin 550 Units/hr (01/29/2017 0742)  . heparin 300 Units/hr (01/25/2017 1213)  . heparin 300 Units/hr (02/21/2017 1212)   PRN Meds:.[MAR Hold] acetaminophen **OR** [MAR Hold] acetaminophen, [MAR Hold] diphenhydrAMINE, famotidine, [MAR Hold] HYDROcodone-acetaminophen, methylPREDNISolone (SOLU-MEDROL) injection, [MAR Hold] ondansetron **OR** [MAR Hold] ondansetron (ZOFRAN) IV   Data Review:   Micro Results Recent Results (from the past 240 hour(s))  Surgical PCR screen     Status: Abnormal   Collection Time: 01/26/2017  5:43 AM  Result Value Ref Range Status   MRSA, PCR NEGATIVE NEGATIVE Final   Staphylococcus aureus POSITIVE (A) NEGATIVE Final    Comment:        The Xpert SA Assay (FDA approved for NASAL specimens in patients over 69 years of age), is one component of a comprehensive surveillance program.  Test performance has been validated by [facility] for patients greater than or equal to 53 year old. It is not intended to diagnose infection nor to guide or monitor treatment.     Radiology  Reports Ct Angio Ao+bifem W & Or Wo Contrast  Result Date: 02/20/2017 CLINICAL DATA:  Loss of bilateral pedal pulses, with cold right foot and tibia, and pale skin at the left lower extremity. Initial encounter. EXAM: CT ANGIOGRAPHY OF ABDOMINAL AORTA WITH ILIOFEMORAL RUNOFF TECHNIQUE: Multidetector CT imaging of the abdomen, pelvis and lower extremities was performed using the standard protocol during bolus administration of intravenous contrast. Multiplanar CT image reconstructions and MIPs were obtained to evaluate the vascular anatomy. CONTRAST:  100 mL of Isovue 370 IV contrast COMPARISON:  CT of the abdomen and pelvis performed 07/05/2015 FINDINGS: VASCULAR Aorta: There is acute occlusion of the distal abdominal aorta just below the origin of the mesenteric artery, extending to the bifurcations of the common iliac arteries bilaterally. The external and internal iliac arteries demonstrate reconstitution, likely secondary to collateral vessels along the abdominal wall and within the peritoneum. Scattered calcification is seen along the abdominal aorta and its branches. Celiac: There is slight narrowing along the celiac trunk. SMA: There appears to be occlusion of one of the branches of the superior mesenteric artery, though it reconstitutes distally. Vague surrounding mesenteric edema is seen. No definite ischemic bowel is seen at this time. Renals: The renal arteries are diminutive but grossly patent bilaterally. IMA: The inferior mesenteric artery remains patent. RIGHT Lower Extremity Inflow: As described above, there is occlusion of the right common iliac artery. There is reconstitution of the right external and internal iliac arteries. Minimal narrowing is suggested along the right common femoral artery. Outflow: The profunda femoris artery appears patent. The superficial femoral artery is only minimally opacified, reflecting more proximal occlusion. Underlying narrowing cannot be excluded. The popliteal  artery is grossly intact, though difficult to characterize. Runoff: Runoff to the right ankle is not well characterized due to more proximal occlusion. Flow is noted within the anterior  tibial artery. LEFT Lower Extremity Inflow: As described above, there is occlusion of the left common iliac artery. There is reconstitution of the left external and internal iliac arteries. Minimal narrowing is suggested along the left common femoral artery. Outflow: The profunda femoris artery appears intact. The superficial femoral artery is only minimally opacified, reflecting more proximal collision. Underlying narrowing cannot be excluded. The popliteal artery is not well characterized, though grossly intact. Runoff: Runoff to the left ankle is not well characterized due to more proximal occlusion. There is suggestion of two-vessel runoff to the level of the left calf. Veins: The visualized venous structures are grossly unremarkable, though difficult to fully assess given the phase of contrast enhancement. Review of the MIP images confirms the above findings. NON-VASCULAR Lower chest: Small mildly loculated bilateral pleural effusions are noted. Underlying atelectasis is noted. There is marked enlargement of the right side of the heart, particularly at the right atrium, concerning for right heart insufficiency. There is reflux of contrast into the hepatic veins and IVC. Hepatobiliary: A 2.4 cm hypodensity is noted at the medial right hepatic lobe. The liver is otherwise grossly unremarkable. Small stones are seen within the gallbladder. The gallbladder is otherwise unremarkable. The common bile duct remains normal in caliber. Pancreas: The pancreas is within normal limits. Spleen: The spleen is unremarkable in appearance. Adrenals/Urinary Tract: The adrenal glands are unremarkable in appearance. Right renal cysts are noted. There is no evidence of hydronephrosis. No renal or ureteral stones are identified. No significant  perinephric stranding is seen. Stomach/Bowel: The stomach is unremarkable in appearance. The small bowel is within normal limits. The appendix is not visualized; there is no evidence for appendicitis. Diffuse diverticulosis is noted along the sigmoid colon, without evidence of diverticulitis. Lymphatic: No retroperitoneal or pelvic sidewall lymphadenopathy is seen. Reproductive: Mild soft tissue inflammation about the bladder could reflect cystitis. The patient is status post hysterectomy. No suspicious adnexal masses are seen. Other: Prominent varices are seen tracking about the vagina and within the perineal soft tissues, extending to the labia. Musculoskeletal: No acute osseous abnormalities are identified. Chronic compression deformities are seen at L1 and L3. The visualized musculature is unremarkable in appearance. Diffuse soft tissue edema is seen along the lower legs and feet bilaterally. No knee joint effusions are seen. There is mild atrophy of the distal thigh musculature bilaterally. IMPRESSION: VASCULAR 1. Acute occlusion of the distal abdominal aorta and common iliac arteries, from just below the level of the origin of the inferior mesenteric artery, to the bifurcations of the common iliac arteries bilaterally. There is reconstitution of the external and internal iliac arteries bilaterally, likely secondary to collateral vessels along the abdominal wall and within the peritoneum. 2. Occlusion of one of the branches of the superior mesenteric artery, though it reconstitutes distally. Vague surrounding mesenteric edema seen. No definite evidence for ischemic bowel at this time. This is likely relatively acute in nature. 3. Runoff to the lower extremities is not well assessed due to more proximal occlusion. There is perhaps 1-vessel runoff to the level of the right calf, and two-vessel runoff to the level of the left calf. 4. Minimal narrowing suggested along the common femoral arteries bilaterally. The  superficial femoral arteries are difficult to fully assess. 5. Slight narrowing of the celiac trunk. 6. Scattered aortic atherosclerosis. 7. Prominent varices noted tracking about the vagina and within the perineal soft tissues, extending to the labia. NON-VASCULAR 1. Marked enlargement of the right side of the heart, particularly at the  right atrium, concerning for right heart insufficiency. Reflux of contrast into the hepatic veins and IVC. 2. Mild soft tissue inflammation about the bladder could reflect cystitis. 3. Small mildly loculated bilateral pleural effusions, with underlying atelectasis. 4. Diffuse soft tissue edema along the lower legs and feet bilaterally. 5. Cholelithiasis.  Gallbladder otherwise unremarkable. 6. 2.4 cm nonspecific hypodensity at the medial right hepatic lobe. 7. Right renal cysts noted. 8. Diffuse diverticulosis along the sigmoid colon, without evidence of diverticulitis. 9. Chronic compression deformities at L1 and L3. 10. Mild atrophy of the distal thigh musculature bilaterally. Critical Value/emergent results were called by telephone at the time of interpretation on 02/21/2017 at 8:57 pm to Dr. Conni Slipper, who verbally acknowledged these results. Electronically Signed   By: Garald Balding M.D.   On: 02/05/2017 21:32     CBC  Recent Labs Lab 02/04/2017 1937 02/21/2017 0534  WBC 6.4 7.1  HGB 14.6 13.9  HCT 43.3 41.5  PLT 180 148*  MCV 100.9* 102.4*  MCH 34.0 34.2*  MCHC 33.7 33.4  RDW 16.9* 17.2*  LYMPHSABS 1.0  --   MONOABS 0.6  --   EOSABS 0.0  --   BASOSABS 0.1  --     Chemistries   Recent Labs Lab 02/02/2017 1937 02/10/2017 0534  NA 142 144  K 3.2* 3.6  CL 105 108  CO2 27 27  GLUCOSE 147* 115*  BUN 24* 21*  CREATININE 1.25* 1.04*  CALCIUM 8.4* 8.1*   ------------------------------------------------------------------------------------------------------------------ estimated creatinine clearance is 34.9 mL/min (A) (by C-G formula based on SCr of 1.04  mg/dL (H)). ------------------------------------------------------------------------------------------------------------------ No results for input(s): HGBA1C in the last 72 hours. ------------------------------------------------------------------------------------------------------------------ No results for input(s): CHOL, HDL, LDLCALC, TRIG, CHOLHDL, LDLDIRECT in the last 72 hours. ------------------------------------------------------------------------------------------------------------------ No results for input(s): TSH, T4TOTAL, T3FREE, THYROIDAB in the last 72 hours.  Invalid input(s): FREET3 ------------------------------------------------------------------------------------------------------------------ No results for input(s): VITAMINB12, FOLATE, FERRITIN, TIBC, IRON, RETICCTPCT in the last 72 hours.  Coagulation profile  Recent Labs Lab 02/02/2017 1937  INR 1.14    No results for input(s): DDIMER in the last 72 hours.  Cardiac Enzymes No results for input(s): CKMB, TROPONINI, MYOGLOBIN in the last 168 hours.  Invalid input(s): CK ------------------------------------------------------------------------------------------------------------------ Invalid input(s): [place]  Patient is 83 year old presented with bilateral lower extremity ischemia and aortoiliac occlusion 1. Aortic occlusion (HCC) -  s/p thrombolytic therapy continue IV heparin 2.   HTN (hypertension) - will use prn hydralazine 3. HLD (hyperlipidemia) - continue  Mevacor 4.  Hypothyroidism - continue Syntroid 5.  GERD (gastroesophageal reflux disease) - home dose PPI     Code Status Orders        Start     Ordered   01/31/2017 2358  Do not attempt resuscitation (DNR)  Continuous    Question Answer Comment  In the event of cardiac or respiratory ARREST Do not call a "code blue"   In the event of cardiac or respiratory ARREST Do not perform Intubation, CPR, defibrillation or ACLS    In the event of cardiac or respiratory ARREST Use medication by any route, position, wound care, and other measures to relive pain and suffering. May use oxygen, suction and manual treatment of airway obstruction as needed for comfort.      02/08/2017 2357    Code Status History    Date Active Date Inactive Code Status Order ID Comments User Context   This patient has a current code status but no historical code status.  Consults  vascular  DVT Prophylaxis  heparin  Lab Results  Component Value Date   PLT 148 (L) 01/27/2017     Time Spent in minutes   43min  Greater than 50% of time spent in care coordination and counseling patient regarding the condition and plan of care.   Dustin Flock M.D on 02/24/2017 at 2:21 PM  Between 7am to 6pm - Pager - [phone]  After 6pm go to [email or web address] - password [email or web address] [facility]   Office  [phone]

## 2017-02-19 NOTE — H&P (Signed)
[facility] History & Physical Update  The patient was interviewed and re-examined.  The patient's previous History and Physical has been reviewed and is unchanged.  There is no change in the plan of care. We plan to proceed with the scheduled procedure to attempt revascularization for limb salvage.  Discussed with son.  Full consult note to follow  Leotis Pain, MD  02/05/2017, 10:53 AM

## 2017-02-19 NOTE — Progress Notes (Signed)
ANTICOAGULATION CONSULT NOTE - Initial Consult  Pharmacy Consult for heparin drip  Indication: acute aortic / iliac occlusion. arterial thrombosis  Allergies  Allergen Reactions  . Aspirin Other (See Comments)    Reaction:  Unknown     Patient Measurements: Height: 5\' 5"  (165.1 cm) Weight: 126 lb 11.2 oz (57.5 kg) IBW/kg (Calculated) : 57  Vital Signs: Temp: 98.1 F (36.7 C) (03/26 0510) Temp Source: Oral (03/26 0510) BP: 108/71 (03/26 0510) Pulse Rate: 70 (03/26 0510)  Labs:  Recent Labs  02/12/2017 1937 02/03/2017 0534  HGB 14.6 13.9  HCT 43.3 41.5  PLT 180 148*  APTT 28  --   LABPROT 14.7  --   INR 1.14  --   HEPARINUNFRC  --  1.00*  CREATININE 1.25*  --     Estimated Creatinine Clearance: 29.1 mL/min (A) (by C-G formula based on SCr of 1.25 mg/dL (H)).   Medical History: Past Medical History:  Diagnosis Date  . Cancer [facility])    brain tumor  . GERD (gastroesophageal reflux disease)   . High cholesterol   . Hypertension   . Hypothyroidism   . Stroke [facility])    Assessment: 83 yo female with acute aortic / iliac occlusion/ arterial thrombosis. Pharmacy consulted for heparin drip dosing and monitoring.   Goal of Therapy:  Heparin level 0.3-0.7 units/ml Monitor platelets by anticoagulation protocol: Yes   Plan:  Baseline line labs ordered Give 3000 units bolus x 1 Start heparin infusion at 750 units/hr Check anti-Xa level in 8 hours and daily while on heparin Continue to monitor H&H and platelets   3/26 AM heparin level 1.00. Hold infusion x 1 hour and restart at 550 units/hr. Recheck heparin level 8 hours after restart.  Eloise Harman, PharmD, BCPS Clinical Pharmacist 02/07/2017 6:24 AM

## 2017-02-19 NOTE — Progress Notes (Signed)
Lactic acid 3.0 relayed to patient's nurse, Lupita Dawn, RN at bedside at [place]; 02/23/2017; Barbaraann Faster, RN

## 2017-02-19 NOTE — OR Nursing (Signed)
uo 132ml since foley catheter inserted.  Dr. Lucky Cowboy notified of low uo

## 2017-02-19 NOTE — Consult Note (Signed)
[facility] Vascular Consult Note  MRN : [id number]  Faith Donovan is a 83 y.o. (18-Apr-1928) female who presents with chief complaint of  Chief Complaint  Patient presents with  . Leg Pain  .  History of Present Illness: I am asked by Dr. Jannifer Franklin from the medicine service to see the patient.  She was admitted last night with lower extremity pain and numbness. She had a CT angiogram which I have independently reviewed which shows a distal aortic occlusion and bilateral common iliac artery occlusions. She was not really walking much before this was difficult to discern the degree of claudication, but her pain started abruptly yesterday afternoon. She denies abdominal pain. She denies fever or chills. She has a large litany of medical issues and is not a great historian. The pain in the legs associated with numbness and tingling. Motor exam is difficult to assess given her previous stroke, brain surgery, and status. There was no clear inciting event or causative factor that started this. Nothing has made it better  Current Facility-Administered Medications  Medication Dose Route Frequency Provider Last Rate Last Dose  . 0.9 %  sodium chloride infusion   Intravenous Continuous Kimberly A Stegmayer, PA-C      . [MAR Hold] acetaminophen (TYLENOL) tablet 650 mg  650 mg Oral Q6H PRN Lance Coon, MD       Or  . Doug Sou Hold] acetaminophen (TYLENOL) suppository 650 mg  650 mg Rectal Q6H PRN Lance Coon, MD      . alteplase (LIMB ISCHEMIA) 10 mg in normal saline (0.02 mg/mL) infusion  0.5 mg/hr Intracatheter Continuous Algernon Huxley, MD      . alteplase (LIMB ISCHEMIA) 10 mg in normal saline (0.02 mg/mL) infusion  0.5 mg/hr Intracatheter Continuous Algernon Huxley, MD      . Doug Sou Hold] atenolol (TENORMIN) tablet 50 mg  50 mg Oral BID Lance Coon, MD   50 mg at 02/13/2017 0102  . [MAR Hold] diphenhydrAMINE (BENADRYL) capsule 25 mg  25 mg Oral QHS PRN Lance Coon, MD      . famotidine  (PEPCID) tablet 40 mg  40 mg Oral PRN Kimberly A Stegmayer, PA-C      . heparin 100-0.45 UNIT/ML-% infusion           . heparin ADULT infusion 100 units/mL (25000 units/248mL sodium chloride 0.45%)  550 Units/hr Intravenous Continuous Lance Coon, MD 5.5 mL/hr at 01/30/2017 0742 550 Units/hr at 01/28/2017 0742  . [MAR Hold] HYDROcodone-acetaminophen (NORCO/VICODIN) 5-325 MG per tablet 1-2 tablet  1-2 tablet Oral Q4H PRN Lance Coon, MD      . Doug Sou Hold] levothyroxine (SYNTHROID, LEVOTHROID) tablet 50 mcg  50 mcg Oral QAC breakfast Lance Coon, MD      . methylPREDNISolone sodium succinate (SOLU-MEDROL) 125 mg/2 mL injection 125 mg  125 mg Intravenous PRN Sela Hua, PA-C      . [MAR Hold] ondansetron (ZOFRAN) tablet 4 mg  4 mg Oral Q6H PRN Lance Coon, MD       Or  . Doug Sou Hold] ondansetron [facility]) injection 4 mg  4 mg Intravenous Q6H PRN Lance Coon, MD      . Doug Sou Hold] pantoprazole (PROTONIX) EC tablet 40 mg  40 mg Oral Daily Lance Coon, MD      . Doug Sou Hold] pravastatin (PRAVACHOL) tablet 40 mg  40 mg Oral q1800 Lance Coon, MD      . Doug Sou Hold] sodium chloride flush (NS) 0.9 % injection  3 mL  3 mL Intravenous Q12H Lance Coon, MD   3 mL at 02/04/2017 0103    Past Medical History:  Diagnosis Date  . Cancer [facility])    brain tumor  . GERD (gastroesophageal reflux disease)   . High cholesterol   . Hypertension   . Hypothyroidism   . Stroke [facility])     Past Surgical History:  Procedure Laterality Date  . ABDOMINAL HYSTERECTOMY    . BRAIN SURGERY      Social History Social History  Substance Use Topics  . Smoking status: Never Smoker  . Smokeless tobacco: Not on file  . Alcohol use No  No IV drug use  Family History Family History  Problem Relation Age of Onset  . Cancer Mother   . Stroke Father   No bleeding disorders, clotting disorders, or aneurysms  Allergies  Allergen Reactions  . Aspirin Other (See Comments)    Reaction:  Unknown      REVIEW OF  SYSTEMS (Negative unless checked)  Constitutional: [] Weight loss  [] Fever  [] Chills Cardiac: [] Chest pain   [] Chest pressure   [x] Palpitations   [] Shortness of breath when laying flat   [] Shortness of breath at rest   [x] Shortness of breath with exertion. Vascular:  [] Pain in legs with walking   [x] Pain in legs at rest   [] Pain in legs when laying flat   [] Claudication   [x] Pain in feet when walking  [x] Pain in feet at rest  [] Pain in feet when laying flat   [] History of DVT   [] Phlebitis   [] Swelling in legs   [] Varicose veins   [] Non-healing ulcers Pulmonary:   [] Uses home oxygen   [] Productive cough   [] Hemoptysis   [] Wheeze  [] COPD   [] Asthma Neurologic:  [x] Dizziness  [] Blackouts   [] Seizures   [x] History of stroke   [] History of TIA  [] Aphasia   [] Temporary blindness   [] Dysphagia   [x] Weakness or numbness in arms   [] Weakness or numbness in legs Musculoskeletal:  [x] Arthritis   [] Joint swelling   [] Joint pain   [] Low back pain Hematologic:  [] Easy bruising  [] Easy bleeding   [] Hypercoagulable state   [] Anemic  [] Hepatitis Gastrointestinal:  [] Blood in stool   [] Vomiting blood  [] Gastroesophageal reflux/heartburn   [] Difficulty swallowing. Genitourinary:  [x] Chronic kidney disease   [] Difficult urination  [] Frequent urination  [] Burning with urination   [] Blood in urine Skin:  [] Rashes   [] Ulcers   [] Wounds Psychological:  [] History of anxiety   []  History of major depression.  Physical Examination  Vitals:   02/16/2017 1105 02/21/2017 1110 02/13/2017 1115 01/29/2017 1120  BP:      Pulse:      Resp:      Temp:      TempSrc:      SpO2: 97% 97% 97% 92%  Weight:      Height:       Body mass index is 20.97 kg/m. Gen: Thin, debilitated female in obvious discomfort Head: [place]/AT, + temporalis wasting.  Ear/Nose/Throat: Hearing diminished, nares w/o erythema or drainage, oropharynx w/o Erythema/Exudate Eyes: Sclera non-icteric, conjunctiva clear Neck: Trachea midline.  Mild JVD present   Pulmonary:  Respirations are labored and coarse bilaterally Cardiac: irregularly irregular Vascular:  Vessel Right Left  Radial Palpable Palpable  Ulnar    Brachial    Carotid    Aorta    Femoral Not Palpable Not Palpable  Popliteal Not Palpable Not Palpable  PT Not Palpable Not Palpable  DP Not Palpable Not  Palpable   Gastrointestinal: soft, non-tender/non-distended. No guarding/reflex.  Musculoskeletal:  Cyanosis with some poor capillary refill bilateral lower extremities. Mild lower extremity edema bilaterally Neurologic: Sensation diminished in both lower extremities.  Symmetrical.  Speech is difficult to discern  Psychiatric: difficult to assess Dermatologic: No rashes or ulcers noted.  No cellulitis or open wounds. Lymph : No Cervical, Axillary, or Inguinal lymphadenopathy.      CBC Lab Results  Component Value Date   WBC 7.1 01/31/2017   HGB 13.9 02/16/2017   HCT 41.5 02/06/2017   MCV 102.4 (H) 02/11/2017   PLT 148 (L) 01/29/2017    BMET    Component Value Date/Time   NA 144 02/20/2017 0534   NA 142 03/10/2014 0643   K 3.6 02/14/2017 0534   K 3.8 03/10/2014 0643   CL 108 02/10/2017 0534   CL 102 03/10/2014 0643   CO2 27 01/25/2017 0534   CO2 32 03/10/2014 0643   GLUCOSE 115 (H) 02/04/2017 0534   GLUCOSE 61 (L) 03/10/2014 0643   BUN 21 (H) 01/29/2017 0534   BUN 12 03/10/2014 0643   CREATININE 1.04 (H) 02/18/2017 0534   CREATININE 0.90 03/10/2014 0643   CALCIUM 8.1 (L) 01/28/2017 0534   CALCIUM 8.9 03/10/2014 0643   GFRNONAA 47 (L) 02/18/2017 0534   GFRNONAA 59 (L) 03/10/2014 0643   GFRAA 55 (L) 02/02/2017 0534   GFRAA >60 03/10/2014 0643   Estimated Creatinine Clearance: 34.9 mL/min (A) (by C-G formula based on SCr of 1.04 mg/dL (H)).  COAG Lab Results  Component Value Date   INR 1.14 02/15/2017   INR 1.4 01/29/2014    Radiology Ct Angio Ao+bifem W & Or Wo Contrast  Result Date: 02/15/2017 CLINICAL DATA:  Loss of bilateral pedal pulses,  with cold right foot and tibia, and pale skin at the left lower extremity. Initial encounter. EXAM: CT ANGIOGRAPHY OF ABDOMINAL AORTA WITH ILIOFEMORAL RUNOFF TECHNIQUE: Multidetector CT imaging of the abdomen, pelvis and lower extremities was performed using the standard protocol during bolus administration of intravenous contrast. Multiplanar CT image reconstructions and MIPs were obtained to evaluate the vascular anatomy. CONTRAST:  100 mL of Isovue 370 IV contrast COMPARISON:  CT of the abdomen and pelvis performed 07/05/2015 FINDINGS: VASCULAR Aorta: There is acute occlusion of the distal abdominal aorta just below the origin of the mesenteric artery, extending to the bifurcations of the common iliac arteries bilaterally. The external and internal iliac arteries demonstrate reconstitution, likely secondary to collateral vessels along the abdominal wall and within the peritoneum. Scattered calcification is seen along the abdominal aorta and its branches. Celiac: There is slight narrowing along the celiac trunk. SMA: There appears to be occlusion of one of the branches of the superior mesenteric artery, though it reconstitutes distally. Vague surrounding mesenteric edema is seen. No definite ischemic bowel is seen at this time. Renals: The renal arteries are diminutive but grossly patent bilaterally. IMA: The inferior mesenteric artery remains patent. RIGHT Lower Extremity Inflow: As described above, there is occlusion of the right common iliac artery. There is reconstitution of the right external and internal iliac arteries. Minimal narrowing is suggested along the right common femoral artery. Outflow: The profunda femoris artery appears patent. The superficial femoral artery is only minimally opacified, reflecting more proximal occlusion. Underlying narrowing cannot be excluded. The popliteal artery is grossly intact, though difficult to characterize. Runoff: Runoff to the right ankle is not well characterized  due to more proximal occlusion. Flow is noted within the anterior tibial  artery. LEFT Lower Extremity Inflow: As described above, there is occlusion of the left common iliac artery. There is reconstitution of the left external and internal iliac arteries. Minimal narrowing is suggested along the left common femoral artery. Outflow: The profunda femoris artery appears intact. The superficial femoral artery is only minimally opacified, reflecting more proximal collision. Underlying narrowing cannot be excluded. The popliteal artery is not well characterized, though grossly intact. Runoff: Runoff to the left ankle is not well characterized due to more proximal occlusion. There is suggestion of two-vessel runoff to the level of the left calf. Veins: The visualized venous structures are grossly unremarkable, though difficult to fully assess given the phase of contrast enhancement. Review of the MIP images confirms the above findings. NON-VASCULAR Lower chest: Small mildly loculated bilateral pleural effusions are noted. Underlying atelectasis is noted. There is marked enlargement of the right side of the heart, particularly at the right atrium, concerning for right heart insufficiency. There is reflux of contrast into the hepatic veins and IVC. Hepatobiliary: A 2.4 cm hypodensity is noted at the medial right hepatic lobe. The liver is otherwise grossly unremarkable. Small stones are seen within the gallbladder. The gallbladder is otherwise unremarkable. The common bile duct remains normal in caliber. Pancreas: The pancreas is within normal limits. Spleen: The spleen is unremarkable in appearance. Adrenals/Urinary Tract: The adrenal glands are unremarkable in appearance. Right renal cysts are noted. There is no evidence of hydronephrosis. No renal or ureteral stones are identified. No significant perinephric stranding is seen. Stomach/Bowel: The stomach is unremarkable in appearance. The small bowel is within normal  limits. The appendix is not visualized; there is no evidence for appendicitis. Diffuse diverticulosis is noted along the sigmoid colon, without evidence of diverticulitis. Lymphatic: No retroperitoneal or pelvic sidewall lymphadenopathy is seen. Reproductive: Mild soft tissue inflammation about the bladder could reflect cystitis. The patient is status post hysterectomy. No suspicious adnexal masses are seen. Other: Prominent varices are seen tracking about the vagina and within the perineal soft tissues, extending to the labia. Musculoskeletal: No acute osseous abnormalities are identified. Chronic compression deformities are seen at L1 and L3. The visualized musculature is unremarkable in appearance. Diffuse soft tissue edema is seen along the lower legs and feet bilaterally. No knee joint effusions are seen. There is mild atrophy of the distal thigh musculature bilaterally. IMPRESSION: VASCULAR 1. Acute occlusion of the distal abdominal aorta and common iliac arteries, from just below the level of the origin of the inferior mesenteric artery, to the bifurcations of the common iliac arteries bilaterally. There is reconstitution of the external and internal iliac arteries bilaterally, likely secondary to collateral vessels along the abdominal wall and within the peritoneum. 2. Occlusion of one of the branches of the superior mesenteric artery, though it reconstitutes distally. Vague surrounding mesenteric edema seen. No definite evidence for ischemic bowel at this time. This is likely relatively acute in nature. 3. Runoff to the lower extremities is not well assessed due to more proximal occlusion. There is perhaps 1-vessel runoff to the level of the right calf, and two-vessel runoff to the level of the left calf. 4. Minimal narrowing suggested along the common femoral arteries bilaterally. The superficial femoral arteries are difficult to fully assess. 5. Slight narrowing of the celiac trunk. 6. Scattered aortic  atherosclerosis. 7. Prominent varices noted tracking about the vagina and within the perineal soft tissues, extending to the labia. NON-VASCULAR 1. Marked enlargement of the right side of the heart, particularly at the right  atrium, concerning for right heart insufficiency. Reflux of contrast into the hepatic veins and IVC. 2. Mild soft tissue inflammation about the bladder could reflect cystitis. 3. Small mildly loculated bilateral pleural effusions, with underlying atelectasis. 4. Diffuse soft tissue edema along the lower legs and feet bilaterally. 5. Cholelithiasis.  Gallbladder otherwise unremarkable. 6. 2.4 cm nonspecific hypodensity at the medial right hepatic lobe. 7. Right renal cysts noted. 8. Diffuse diverticulosis along the sigmoid colon, without evidence of diverticulitis. 9. Chronic compression deformities at L1 and L3. 10. Mild atrophy of the distal thigh musculature bilaterally. Critical Value/emergent results were called by telephone at the time of interpretation on 01/28/2017 at 8:57 pm to Dr. Conni Slipper, who verbally acknowledged these results. Electronically Signed   By: Garald Balding M.D.   On: 01/30/2017 21:32      Assessment/Plan 1. Aortoiliac occlusion. Acute. Likely embolic from cardiac origin although thrombosis of the native disease is also possible. This is a very severe and not only limb threatening situation but has a very high mortality. I have discussed the case with the ER physician and have also discussed with the son today the gravity of the situation. We are going to try to improve her perfusion not only for limb salvage but with an aortic occlusion to improve her overall outcome. Very high risk patient with multiple medical comorbidities and a high risk of morbidity and mortality 2. CKD. Has already had contrast for her CT scan. Hydration important to avoid further renal insufficiency with second contrast load today.  3. History of stroke and brain tumor. Does increase  her risk of intracranial bleeding, but these were remote.  4. Hypertension. Stable on outpatient medications and blood pressure control important in reducing the progression of atherosclerotic disease. On appropriate oral medications. 5. Hyperlipidemia. Stable on outpatient medications and lipid control important in reducing the progression of atherosclerotic disease. Continue statin therapy'   Sabrea Sankey, MD  01/30/2017 11:44 AM    This note was created with Dragon medical transcription system.  Any error is purely unintentional

## 2017-02-20 ENCOUNTER — Inpatient Hospital Stay: Payer: Medicare Other

## 2017-02-20 ENCOUNTER — Encounter: Payer: Self-pay | Admitting: Vascular Surgery

## 2017-02-20 LAB — BASIC METABOLIC PANEL
ANION GAP: 15 (ref 5–15)
BUN: 28 mg/dL — AB (ref 6–20)
CALCIUM: 7.5 mg/dL — AB (ref 8.9–10.3)
CHLORIDE: 109 mmol/L (ref 101–111)
CO2: 22 mmol/L (ref 22–32)
CREATININE: 1.47 mg/dL — AB (ref 0.44–1.00)
GFR, EST AFRICAN AMERICAN: 36 mL/min — AB (ref >60)
GFR, EST NON AFRICAN AMERICAN: 31 mL/min — AB (ref >60)
Glucose, Bld: 167 mg/dL — ABNORMAL HIGH (ref 65–99)
POTASSIUM: 3.2 mmol/L — AB (ref 3.5–5.1)
SODIUM: 146 mmol/L — AB (ref 135–145)

## 2017-02-20 LAB — CBC
HCT: 37.6 % (ref 35.0–47.0)
Hemoglobin: 12.2 g/dL (ref 12.0–16.0)
MCH: 33.5 pg (ref 26.0–34.0)
MCHC: 32.4 g/dL (ref 32.0–36.0)
MCV: 103.2 fL — ABNORMAL HIGH (ref 80.0–100.0)
PLATELETS: 149 10*3/uL — AB (ref 150–440)
RBC: 3.64 MIL/uL — AB (ref 3.80–5.20)
RDW: 17.7 % — AB (ref 11.5–14.5)
WBC: 20.9 10*3/uL — AB (ref 3.6–11.0)

## 2017-02-20 LAB — LACTIC ACID, PLASMA: Lactic Acid, Venous: 6.8 mmol/L (ref 0.5–1.9)

## 2017-02-20 LAB — HEPATIC FUNCTION PANEL
ALBUMIN: 2.6 g/dL — AB (ref 3.5–5.0)
ALK PHOS: 60 U/L (ref 38–126)
ALT: 22 U/L (ref 14–54)
AST: 82 U/L — ABNORMAL HIGH (ref 15–41)
BILIRUBIN DIRECT: 0.5 mg/dL (ref 0.1–0.5)
BILIRUBIN INDIRECT: 1.2 mg/dL — AB (ref 0.3–0.9)
BILIRUBIN TOTAL: 1.7 mg/dL — AB (ref 0.3–1.2)
Total Protein: 4.6 g/dL — ABNORMAL LOW (ref 6.5–8.1)

## 2017-02-20 LAB — HEPARIN LEVEL (UNFRACTIONATED)
HEPARIN UNFRACTIONATED: 0.4 [IU]/mL (ref 0.30–0.70)
Heparin Unfractionated: 0.97 IU/mL — ABNORMAL HIGH (ref 0.30–0.70)
Heparin Unfractionated: 0.29 IU/mL — ABNORMAL LOW (ref 0.30–0.70)

## 2017-02-20 LAB — LIPASE, BLOOD: Lipase: 36 U/L (ref 11–51)

## 2017-02-20 MED ORDER — METRONIDAZOLE IN NACL 5-0.79 MG/ML-% IV SOLN
500.0000 mg | Freq: Three times a day (TID) | INTRAVENOUS | Status: DC
  Administered 2017-02-20: 500 mg via INTRAVENOUS
  Filled 2017-02-20 (×3): qty 100

## 2017-02-20 MED ORDER — SODIUM CHLORIDE 0.9 % IV BOLUS (SEPSIS)
1000.0000 mL | Freq: Once | INTRAVENOUS | Status: AC
  Administered 2017-02-20: 1000 mL via INTRAVENOUS

## 2017-02-20 MED ORDER — STERILE WATER FOR INJECTION IV SOLN
INTRAVENOUS | Status: DC
  Filled 2017-02-20 (×2): qty 850

## 2017-02-20 MED ORDER — HEPARIN (PORCINE) IN NACL 100-0.45 UNIT/ML-% IJ SOLN
500.0000 [IU]/h | INTRAMUSCULAR | Status: DC
  Administered 2017-02-20: 450 [IU]/h via INTRAVENOUS

## 2017-02-20 MED ORDER — STERILE WATER FOR INJECTION IV SOLN
INTRAVENOUS | Status: DC
  Administered 2017-02-20 – 2017-02-21 (×2): via INTRAVENOUS
  Filled 2017-02-20 (×4): qty 900

## 2017-02-20 MED ORDER — SODIUM CHLORIDE 0.9 % IV SOLN
INTRAVENOUS | Status: DC
  Administered 2017-02-20: 03:00:00 via INTRAVENOUS

## 2017-02-20 MED ORDER — SODIUM BICARBONATE 8.4 % IV SOLN
INTRAVENOUS | Status: DC
  Administered 2017-02-20: 03:00:00 via INTRAVENOUS
  Filled 2017-02-20 (×3): qty 150

## 2017-02-20 MED ORDER — ORAL CARE MOUTH RINSE
15.0000 mL | Freq: Two times a day (BID) | OROMUCOSAL | Status: DC
  Administered 2017-02-20 – 2017-02-21 (×2): 15 mL via OROMUCOSAL

## 2017-02-20 MED ORDER — SODIUM CHLORIDE 0.9 % IV BOLUS (SEPSIS)
500.0000 mL | Freq: Once | INTRAVENOUS | Status: AC
  Administered 2017-02-20: 500 mL via INTRAVENOUS

## 2017-02-20 MED ORDER — CHLORHEXIDINE GLUCONATE 0.12 % MT SOLN
15.0000 mL | Freq: Two times a day (BID) | OROMUCOSAL | Status: DC
  Administered 2017-02-20 – 2017-02-21 (×4): 15 mL via OROMUCOSAL
  Filled 2017-02-20 (×2): qty 15

## 2017-02-20 NOTE — Progress Notes (Signed)
No family at bedside. Son, [name] unavailable via telephone. No voicemail. Palliative NP will attempt to reach son again tomorrow to discuss [place] and options.   NO CHARGE  Ihor Dow, FNP-C Palliative Medicine Team  Phone: [phone] Fax: [phone]

## 2017-02-20 NOTE — Consult Note (Signed)
Date: 02/20/2017                  Patient Name:  Faith Donovan  MRN: [id number]  DOB: [birth year]  Age / Sex: 83 y.o., female         PCP: Marguerita Merles, MD                 Service Requesting Consult: IM                 Reason for Consult: ARF            History of Present Illness: Patient is a 83 y.o. female with medical problems of stroke with residual deficits, Surgery for brain tumor, HTN and severe PVD who was admitted to [facility] on 02/06/2017 for evaluation of leg pain.  Patient presented to hospital by EMS for leg pain.  Upon arrival, her legs were cold to touch.  This was of relatively sudden onset. He underwent a CT angiogram that showed distal aortic occlusion and bilateral iliac artery occlusions. She underwent thrombolytic therapy and angioplasty of aorta and b/l common iliac arteries  Patient is currently being monitored in the ICU She has elevated lactic acid, elevated WBC count. Her pH is 7.16, CO2 27 She is not able to provide much history Nursing staff reports that the urine output was 150 cc for the past 6-7 hours Noncontrast CT of abdomen done this morning suggests findings consistent with ischemic colitis and abdominal aortic obstruction Retention all IV contrast in the kidneys consistent with severe acute kidney injury Serum creatinine continues to worsen   Medications: Outpatient medications: Prescriptions Prior to Admission  Medication Sig Dispense Refill Last Dose  . atenolol (TENORMIN) 50 MG tablet Take 50 mg by mouth 2 (two) times daily.    unknown at unknown  . furosemide (LASIX) 20 MG tablet Take 20 mg by mouth every other day.   unknown at unknown  . levothyroxine (SYNTHROID, LEVOTHROID) 50 MCG tablet Take 50 mcg by mouth daily before breakfast.   unknown at unknown  . lovastatin (MEVACOR) 40 MG tablet Take 40 mg by mouth every evening.    unknown at unknown  . omeprazole (PRILOSEC) 20 MG capsule Take 20 mg by mouth daily.   unknown at unknown  .  polyethylene glycol (MIRALAX) packet Take 17 g by mouth daily as needed for mild constipation or moderate constipation. 14 each 0   . senna-docusate (SENOKOT-S) 8.6-50 MG per tablet Take 1-2 tablets by mouth 2 (two) times daily.   07/05/2015 at Unknown time    Current medications: Current Facility-Administered Medications  Medication Dose Route Frequency Provider Last Rate Last Dose  . acetaminophen (TYLENOL) tablet 650 mg  650 mg Oral Q6H PRN Lance Coon, MD       Or  . acetaminophen (TYLENOL) suppository 650 mg  650 mg Rectal Q6H PRN Lance Coon, MD      . chlorhexidine (PERIDEX) 0.12 % solution 15 mL  15 mL Mouth Rinse BID Lance Coon, MD   15 mL at 02/20/17 1150  . diphenhydrAMINE (BENADRYL) capsule 25 mg  25 mg Oral QHS PRN Lance Coon, MD      . heparin ADULT infusion 100 units/mL (25000 units/233mL sodium chloride 0.45%)  500 Units/hr Intravenous Continuous Dustin Flock, MD 5 mL/hr at 02/20/17 1444 500 Units/hr at 02/20/17 1444  . HYDROcodone-acetaminophen (NORCO/VICODIN) 5-325 MG per tablet 1-2 tablet  1-2 tablet Oral Q4H PRN Lance Coon, MD      .  levothyroxine (SYNTHROID, LEVOTHROID) tablet 50 mcg  50 mcg Oral QAC breakfast Lance Coon, MD      . MEDLINE mouth rinse  15 mL Mouth Rinse q12n4p Lance Coon, MD   15 mL at 02/20/17 1525  . metoprolol (LOPRESSOR) injection 5 mg  5 mg Intravenous Q6H Lance Coon, MD   5 mg at 02/20/17 1148  . ondansetron (ZOFRAN) tablet 4 mg  4 mg Oral Q6H PRN Lance Coon, MD       Or  . ondansetron [facility]) injection 4 mg  4 mg Intravenous Q6H PRN Lance Coon, MD      . pantoprazole (PROTONIX) EC tablet 40 mg  40 mg Oral Daily Lance Coon, MD      . pravastatin (PRAVACHOL) tablet 40 mg  40 mg Oral q1800 Lance Coon, MD      . sodium bicarbonate 100 mEq in sterile water 1,000 mL infusion   Intravenous Continuous Wilhelmina Mcardle, MD 75 mL/hr at 02/20/17 1100    . sodium chloride flush (NS) 0.9 % injection 3 mL  3 mL Intravenous Q12H Lance Coon,  MD   3 mL at 02/20/17 1000      Allergies: Allergies  Allergen Reactions  . Aspirin Other (See Comments)    Reaction:  Unknown       Past Medical History: Past Medical History:  Diagnosis Date  . Cancer [facility])    brain tumor  . GERD (gastroesophageal reflux disease)   . High cholesterol   . Hypertension   . Hypothyroidism   . Stroke [facility])      Past Surgical History: Past Surgical History:  Procedure Laterality Date  . ABDOMINAL AORTOGRAM N/A 01/27/2017   Procedure: Abdominal Aortogram;  Surgeon: Algernon Huxley, MD;  Location: [place] [facility] LAB;  Service: Cardiovascular;  Laterality: N/A;  . ABDOMINAL HYSTERECTOMY    . AORTIC INTERVENTION N/A 01/26/2017   Procedure: Aortic Intervention;  Surgeon: Algernon Huxley, MD;  Location: [place] CV LAB;  Service: Cardiovascular;  Laterality: N/A;  . BRAIN SURGERY    . LOWER EXTREMITY ANGIOGRAPHY Bilateral 01/26/2017   Procedure: Lower Extremity Angiography;  Surgeon: Algernon Huxley, MD;  Location: [place] CV LAB;  Service: Cardiovascular;  Laterality: Bilateral;     Family History: Family History  Problem Relation Age of Onset  . Cancer Mother   . Stroke Father      Social History: Social History   Social History  . Marital status: Widowed    Spouse name: N/A  . Number of children: N/A  . Years of education: N/A   Occupational History  . Not on file.   Social History Main Topics  . Smoking status: Never Smoker  . Smokeless tobacco: Not on file  . Alcohol use No  . Drug use: No  . Sexual activity: Not on file   Other Topics Concern  . Not on file   Social History Narrative  . No narrative on file     Review of Systems: not available due to patient's critical condition Gen:  HEENT:  CV:  Resp:  GI: GU :  MS:  Derm:   Psych: Heme:  Neuro:  Endocrine  Vital Signs: Blood pressure 107/79, pulse (!) 107, temperature 98.6 F (37 C), temperature source Core (Comment), resp. rate 16, height 5\' 5"   (1.651 m), weight 57.2 kg (126 lb), SpO2 97 %.   Intake/Output Summary (Last 24 hours) at 02/20/17 1628 Last data filed at 02/20/17 1520  Gross per  24 hour  Intake            701.9 ml  Output              635 ml  Net             66.9 ml    Weight trends: Filed Weights   02/14/2017 1917 02/23/2017 0017 02/15/2017 0802  Weight: 54.4 kg (120 lb) 57.5 kg (126 lb 11.2 oz) 57.2 kg (126 lb)    Physical Exam: General:  critically ill-appearing  HEENT Right facial droop, anicteric, dry mucous membranes, poor dentition  Neck: supple  Lungs: [place] O2, scattered rhonchi, normal breathing effort  Heart::  tachycardic, irregular  Abdomen: Distended, decreased bowel sounds, tender to palpation  Extremities:  trace edema  Neurologic: Opens eyes to sound, not following commands  Skin: No acute rashes     Foley: present       Lab results: Basic Metabolic Panel:  Recent Labs Lab 02/13/2017 0534 02/21/2017 2257 02/20/17 1315  NA 144 146* 146*  K 3.6 3.7 3.2*  CL 108 111 109  CO2 27 20* 22  GLUCOSE 115* 154* 167*  BUN 21* 24* 28*  CREATININE 1.04* 1.35* 1.47*  CALCIUM 8.1* 7.8* 7.5*    Liver Function Tests:  Recent Labs Lab 02/20/17 1315  AST 82*  ALT 22  ALKPHOS 60  BILITOT 1.7*  PROT 4.6*  ALBUMIN 2.6*    Recent Labs Lab 02/20/17 1315  LIPASE 36   No results for input(s): AMMONIA in the last 168 hours.  CBC:  Recent Labs Lab 02/06/2017 1937 02/21/2017 0534 02/20/17 0358  WBC 6.4 7.1 20.9*  NEUTROABS 4.7  --   --   HGB 14.6 13.9 12.2  HCT 43.3 41.5 37.6  MCV 100.9* 102.4* 103.2*  PLT 180 148* 149*    Cardiac Enzymes: No results for input(s): CKTOTAL, TROPONINI in the last 168 hours.  BNP: Invalid input(s): POCBNP  CBG:  Recent Labs Lab 01/26/2017 2057  GLUCAP 114*    Microbiology: Recent Results (from the past 720 hour(s))  Surgical PCR screen     Status: Abnormal   Collection Time: 02/21/2017  5:43 AM  Result Value Ref Range Status   MRSA, PCR NEGATIVE  NEGATIVE Final   Staphylococcus aureus POSITIVE (A) NEGATIVE Final    Comment:        The Xpert SA Assay (FDA approved for NASAL specimens in patients over 74 years of age), is one component of a comprehensive surveillance program.  Test performance has been validated by [facility] for patients greater than or equal to 5 year old. It is not intended to diagnose infection nor to guide or monitor treatment.      Coagulation Studies:  Recent Labs  02/20/2017 1937  LABPROT 14.7  INR 1.14    Urinalysis: No results for input(s): COLORURINE, LABSPEC, PHURINE, GLUCOSEU, HGBUR, BILIRUBINUR, KETONESUR, PROTEINUR, UROBILINOGEN, NITRITE, LEUKOCYTESUR in the last 72 hours.  Invalid input(s): APPERANCEUR      Imaging: Ct Abdomen Pelvis Wo Contrast  Result Date: 02/20/2017 CLINICAL DATA:  Lower extremity pain and numbness. Distal aortic occlusion. EXAM: CT ABDOMEN AND PELVIS WITHOUT CONTRAST TECHNIQUE: Multidetector CT imaging of the abdomen and pelvis was performed following the standard protocol without IV contrast. COMPARISON:  CTA 02/20/2017 FINDINGS: Lower chest: Mild increase in LEFT pleural effusion. Bibasilar atelectasis. Hepatobiliary: Non IV contrast imaging demonstrates new new hepatic lesion. Low-density lesion in the LEFT hepatic lobe has simple fluid attenuation. Small lesion in the  RIGHT hepatic lobe (image 27, series 2 likely represent partially calcified hemangioma. Vicarious excretion of contrast into the gallbladder. Pancreas: Pancreas is normal. No ductal dilatation. No pancreatic inflammation. Spleen: Normal spleen Adrenals/urinary tract: There is retention of IV contrast from CT exam 2 days prior the consistent with hypotension renal insufficiency bilateral renal cysts. Ureters normal. Contrast within the bladder. Bladder collapse from Foley catheter. Stomach/Bowel: Stomach, small-bowel cecum normal. There is mild bowel wall edema associated the ascending and proximal  transverse colon. The bowel wall thickening of the descending colon sigmoid colon and rectum. The milder in the rectum diverticulosis of the sigmoid. Moderate amount free fluid along the ascending colon and in the pelvis. No evidence pneumatosis portal venous gas. Vascular/Lymphatic: Heavy intimal calcification abdominal aorta. No aneurysm Reproductive: Post hysterectomy Other: Moderate free fluid Musculoskeletal: No aggressive osseous lesion. IMPRESSION: 1. Edema associated with the ascending transverse colon and to lesser degree the descending and sigmoid colon consistent with pancolitis. Consider ischemic colitis with the abdominal aortic obstruction seen on comparison CTA exam. No pneumatosis or portal venous gas. 2. Increase in intraperitoneal free fluid along the pericolic gutters within the pelvis may relate to bowel pathology or renal insufficiency 3. Retention of IV contrast the kidneys consistent with with severe renal insufficiency and hypertension 4. Increase in LEFT pleural effusion. Bibasilar atelectasis additionally. These results will be called to the ordering clinician or representative by the Radiologist Assistant, and communication documented in the PACS or zVision Dashboard. Electronically Signed   By: Suzy Bouchard M.D.   On: 02/20/2017 11:44   Ct Angio Ao+bifem W & Or Wo Contrast  Result Date: 02/23/2017 CLINICAL DATA:  Loss of bilateral pedal pulses, with cold right foot and tibia, and pale skin at the left lower extremity. Initial encounter. EXAM: CT ANGIOGRAPHY OF ABDOMINAL AORTA WITH ILIOFEMORAL RUNOFF TECHNIQUE: Multidetector CT imaging of the abdomen, pelvis and lower extremities was performed using the standard protocol during bolus administration of intravenous contrast. Multiplanar CT image reconstructions and MIPs were obtained to evaluate the vascular anatomy. CONTRAST:  100 mL of Isovue 370 IV contrast COMPARISON:  CT of the abdomen and pelvis performed 07/05/2015 FINDINGS:  VASCULAR Aorta: There is acute occlusion of the distal abdominal aorta just below the origin of the mesenteric artery, extending to the bifurcations of the common iliac arteries bilaterally. The external and internal iliac arteries demonstrate reconstitution, likely secondary to collateral vessels along the abdominal wall and within the peritoneum. Scattered calcification is seen along the abdominal aorta and its branches. Celiac: There is slight narrowing along the celiac trunk. SMA: There appears to be occlusion of one of the branches of the superior mesenteric artery, though it reconstitutes distally. Vague surrounding mesenteric edema is seen. No definite ischemic bowel is seen at this time. Renals: The renal arteries are diminutive but grossly patent bilaterally. IMA: The inferior mesenteric artery remains patent. RIGHT Lower Extremity Inflow: As described above, there is occlusion of the right common iliac artery. There is reconstitution of the right external and internal iliac arteries. Minimal narrowing is suggested along the right common femoral artery. Outflow: The profunda femoris artery appears patent. The superficial femoral artery is only minimally opacified, reflecting more proximal occlusion. Underlying narrowing cannot be excluded. The popliteal artery is grossly intact, though difficult to characterize. Runoff: Runoff to the right ankle is not well characterized due to more proximal occlusion. Flow is noted within the anterior tibial artery. LEFT Lower Extremity Inflow: As described above, there is occlusion of the left  common iliac artery. There is reconstitution of the left external and internal iliac arteries. Minimal narrowing is suggested along the left common femoral artery. Outflow: The profunda femoris artery appears intact. The superficial femoral artery is only minimally opacified, reflecting more proximal collision. Underlying narrowing cannot be excluded. The popliteal artery is not  well characterized, though grossly intact. Runoff: Runoff to the left ankle is not well characterized due to more proximal occlusion. There is suggestion of two-vessel runoff to the level of the left calf. Veins: The visualized venous structures are grossly unremarkable, though difficult to fully assess given the phase of contrast enhancement. Review of the MIP images confirms the above findings. NON-VASCULAR Lower chest: Small mildly loculated bilateral pleural effusions are noted. Underlying atelectasis is noted. There is marked enlargement of the right side of the heart, particularly at the right atrium, concerning for right heart insufficiency. There is reflux of contrast into the hepatic veins and IVC. Hepatobiliary: A 2.4 cm hypodensity is noted at the medial right hepatic lobe. The liver is otherwise grossly unremarkable. Small stones are seen within the gallbladder. The gallbladder is otherwise unremarkable. The common bile duct remains normal in caliber. Pancreas: The pancreas is within normal limits. Spleen: The spleen is unremarkable in appearance. Adrenals/Urinary Tract: The adrenal glands are unremarkable in appearance. Right renal cysts are noted. There is no evidence of hydronephrosis. No renal or ureteral stones are identified. No significant perinephric stranding is seen. Stomach/Bowel: The stomach is unremarkable in appearance. The small bowel is within normal limits. The appendix is not visualized; there is no evidence for appendicitis. Diffuse diverticulosis is noted along the sigmoid colon, without evidence of diverticulitis. Lymphatic: No retroperitoneal or pelvic sidewall lymphadenopathy is seen. Reproductive: Mild soft tissue inflammation about the bladder could reflect cystitis. The patient is status post hysterectomy. No suspicious adnexal masses are seen. Other: Prominent varices are seen tracking about the vagina and within the perineal soft tissues, extending to the labia.  Musculoskeletal: No acute osseous abnormalities are identified. Chronic compression deformities are seen at L1 and L3. The visualized musculature is unremarkable in appearance. Diffuse soft tissue edema is seen along the lower legs and feet bilaterally. No knee joint effusions are seen. There is mild atrophy of the distal thigh musculature bilaterally. IMPRESSION: VASCULAR 1. Acute occlusion of the distal abdominal aorta and common iliac arteries, from just below the level of the origin of the inferior mesenteric artery, to the bifurcations of the common iliac arteries bilaterally. There is reconstitution of the external and internal iliac arteries bilaterally, likely secondary to collateral vessels along the abdominal wall and within the peritoneum. 2. Occlusion of one of the branches of the superior mesenteric artery, though it reconstitutes distally. Vague surrounding mesenteric edema seen. No definite evidence for ischemic bowel at this time. This is likely relatively acute in nature. 3. Runoff to the lower extremities is not well assessed due to more proximal occlusion. There is perhaps 1-vessel runoff to the level of the right calf, and two-vessel runoff to the level of the left calf. 4. Minimal narrowing suggested along the common femoral arteries bilaterally. The superficial femoral arteries are difficult to fully assess. 5. Slight narrowing of the celiac trunk. 6. Scattered aortic atherosclerosis. 7. Prominent varices noted tracking about the vagina and within the perineal soft tissues, extending to the labia. NON-VASCULAR 1. Marked enlargement of the right side of the heart, particularly at the right atrium, concerning for right heart insufficiency. Reflux of contrast into the hepatic veins and  IVC. 2. Mild soft tissue inflammation about the bladder could reflect cystitis. 3. Small mildly loculated bilateral pleural effusions, with underlying atelectasis. 4. Diffuse soft tissue edema along the lower legs  and feet bilaterally. 5. Cholelithiasis.  Gallbladder otherwise unremarkable. 6. 2.4 cm nonspecific hypodensity at the medial right hepatic lobe. 7. Right renal cysts noted. 8. Diffuse diverticulosis along the sigmoid colon, without evidence of diverticulitis. 9. Chronic compression deformities at L1 and L3. 10. Mild atrophy of the distal thigh musculature bilaterally. Critical Value/emergent results were called by telephone at the time of interpretation on 02/16/2017 at 8:57 pm to Dr. Conni Slipper, who verbally acknowledged these results. Electronically Signed   By: Garald Balding M.D.   On: 02/07/2017 21:32   Dg Chest Port 1 View  Result Date: 02/20/2017 CLINICAL DATA:  Cough. EXAM: PORTABLE CHEST 1 VIEW COMPARISON:  Chest radiograph 07/05/2015, lung bases from abdominal CT 02/11/2017 FINDINGS: Moderate cardiomegaly. Atherosclerosis of the thoracic aorta. Bilateral pleural effusions, larger on the right which is likely partially loculated. No pulmonary edema. Probable compressive atelectasis in the lower lobes. No pneumothorax. Bones are under mineralized. IMPRESSION: Cardiomegaly with moderate bilateral pleural effusions. Right pleural effusion is likely partially loculated. Thoracic aortic atherosclerosis. Electronically Signed   By: Jeb Levering M.D.   On: 02/20/2017 00:31      Assessment & Plan: Pt is a 83 y.o. caucasian female  was admitted on 02/21/2017  Patient has critical illness.  She presented with limb ischemia and aortic occlusion that was diagnosed on a CT angiogram requiring IV contrast exposure.  Subsequently, she underwent thrombolytic therapy and angioplasty for revascularization.  Repeat CT showed changes consistent with ischemic colitis.  Patient has elevated lactic acid, severe acidosis. Other findings include moderate right sided cardiomegaly suggesting rt heart failure She has developed severe acute kidney injury and is oliguric Serum creatinine and BUN are rapidly  increasing Potassium is low at 3.2  Due to severe critical illness and multiorgan failure, patient's prognosis is extremely poor.  If family and crticial care team desire aggressive care, patient can be initiated on CRRT however it might not change the ultimate outcome. Palliative care evaluation is ongoing. We will continue to follow

## 2017-02-20 NOTE — Care Management Note (Signed)
Case Management Note  Patient Details  Name: Faith Donovan MRN: [id number] Date of Birth: [birth year]  Subjective/Objective:                  Spoke with son Faith Donovan by phone. He does not drive and not able to visit with patient unless someone brings him. He is slow to speak but states he manages on his own and able to walk- he said "he can't cook" though. Faith Donovan and patient live together. Patient uses a wheelchair- up and down off toilet but no walking. PCP is with [facility] and medications too but she hasn't been in a while. He states his father used to take her to the doctor.  Father died Feb 04, 2017. They have never applied for public transportation. Patient/She has been able to feed herself per Faith Donovan.  Always [facility] comes 3 times a week to bath and do some house work. Faith Donovan states he opens canned foods "but he/she doesn't eat much". He states she always complains about mouth being sore. He states he is able to cook muffins- "in the oven". They basically eat quick and easy to make foods. She was followed by hospice "but they dropped her because she was doing so well".  It was [facility] per Faith Donovan. Faith Donovan states that he spoke with Faith Donovan and Faith Donovan said that a nurse at the hospital would be able to see her. Faith Donovan states she has a hospital bed. Faith Donovan is a neighbor that has been helping them with transportation per Faith Donovan.   Action/Plan: Message sent to Faith Donovan with [facility] hospice to check for updates.    Expected Discharge Date:                  Expected Discharge Plan:     In-House Referral:     Discharge planning Services  CM Consult  Post Acute Care Choice:  Home Health, Durable Medical Equipment Choice offered to:  Adult Children  DME Arranged:    DME Agency:     HH Arranged:    HH Agency:     Status of Service:  In process, will continue to follow  If discussed at Long Length of Stay Meetings, dates discussed:    Additional Comments:  Faith Garfinkel,  RN 02/20/2017, 11:25 AM

## 2017-02-20 NOTE — Progress Notes (Signed)
ANTICOAGULATION CONSULT NOTE - Initial Consult  Pharmacy Consult for heparin drip  Indication: acute aortic / iliac occlusion. arterial thrombosis       Allergies  Allergen Reactions  . Aspirin Other (See Comments)    Reaction:  Unknown     Patient Measurements: Height: 5\' 5"  (165.1 cm) Weight: 126 lb (57.2 kg) IBW/kg (Calculated) : 57  Vital Signs: BP: 119/99 (03/26 2022) Pulse Rate: 112 (03/26 1945)  Labs:  Recent Labs (last 2 labs)    Recent Labs  02/20/2017 1937 02/12/2017 0534  HGB 14.6 13.9  HCT 43.3 41.5  PLT 180 148*  APTT 28  --   LABPROT 14.7  --   INR 1.14  --   HEPARINUNFRC  --  1.00*  CREATININE 1.25* 1.04*      Estimated Creatinine Clearance: 34.9 mL/min (A) (by C-G formula based on SCr of 1.04 mg/dL (H)).   Medical History:     Past Medical History:  Diagnosis Date  . Cancer [facility])    brain tumor  . GERD (gastroesophageal reflux disease)   . High cholesterol   . Hypertension   . Hypothyroidism   . Stroke [facility])    Assessment: 83 yo female with acute aortic / iliac occlusion/ arterial thrombosis. Patient has acute BLE ischemia with aortoiliac occlusion. Pharmacy consulted to assist with heparin drip dosing and monitoring.  Patient underwent angiogram this morning with catheter directed thrombolytic therapy with TPA and placement of infusion catheters for continued thrombolytic therapy. Patient had heparin 300 units/hr running intra-arterial with catheters and then went for repeat angiogram this evening.   Spoke with vascular surgeon post-op. Alteplase and heparin 300 units/hr orders discontinued per discussion with MD. Patient is to be resumed on heparin 550 units/hr now and will check HL in 6 hours with a goal of 0.3 - 0.5 (lower end of therapeutic range).  Goal of Therapy:  Heparin level 0.3-0.5 units/ml Monitor platelets by anticoagulation protocol: Yes   Plan:  Entered order to resume heparin infusion at 550  units/hr. Will order HL in 6 hours and CBC with AM labs tomorrow.   3/27 @ 0253 HL: 0.97 supratherapeutic. Will hold heparin drip for 1 hour considering patient has had two supratherapeutic levels and has received tPA. Will decrease drip rate to 450 units/hour. Will recheck HL 3/27 @ 1230. Will continue to monitor CBC.  Thank you for this consult.  Tobie Lords, PharmD, BCPS Clinical Pharmacist 02/20/2017

## 2017-02-20 NOTE — Progress Notes (Addendum)
ANTICOAGULATION CONSULT NOTE - Initial Consult  Pharmacy Consult for heparin drip  Indication: acute aortic / iliac occlusion. arterial thrombosis       Allergies  Allergen Reactions  . Aspirin Other (See Comments)    Reaction:  Unknown     Patient Measurements: Height: 5\' 5"  (165.1 cm) Weight: 126 lb (57.2 kg) IBW/kg (Calculated) : 57  Vital Signs: BP: 119/99 (03/26 2022) Pulse Rate: 112 (03/26 1945)  Labs:  Recent Labs (last 2 labs)    Recent Labs  02/11/2017 1937 02/15/2017 0534  HGB 14.6 13.9  HCT 43.3 41.5  PLT 180 148*  APTT 28  --   LABPROT 14.7  --   INR 1.14  --   HEPARINUNFRC  --  1.00*  CREATININE 1.25* 1.04*      Estimated Creatinine Clearance: 34.9 mL/min (A) (by C-G formula based on SCr of 1.04 mg/dL (H)).   Medical History:     Past Medical History:  Diagnosis Date  . Cancer [facility])    brain tumor  . GERD (gastroesophageal reflux disease)   . High cholesterol   . Hypertension   . Hypothyroidism   . Stroke [facility])    Assessment: 83 yo female with acute aortic / iliac occlusion/ arterial thrombosis. Patient has acute BLE ischemia with aortoiliac occlusion. Pharmacy consulted to assist with heparin drip dosing and monitoring.  Patient underwent angiogram this morning with catheter directed thrombolytic therapy with TPA and placement of infusion catheters for continued thrombolytic therapy. Patient had heparin 300 units/hr running intra-arterial with catheters and then went for repeat angiogram this evening.   Spoke with vascular surgeon post-op. Alteplase and heparin 300 units/hr orders discontinued per discussion with MD. Patient is to be resumed on heparin 550 units/hr now and will check HL in 6 hours with a goal of 0.3 - 0.5 (lower end of therapeutic range).  Goal of Therapy:  Heparin level 0.3-0.5 units/ml Monitor platelets by anticoagulation protocol: Yes   Plan:  Entered order to resume heparin infusion at 550  units/hr. Will order HL in 6 hours and CBC with AM labs tomorrow.   3/27 @ 0253 HL: 0.97 supratherapeutic. Will hold heparin drip for 1 hour considering patient has had two supratherapeutic levels and has received tPA. Will decrease drip rate to 450 units/hour. Will recheck HL 3/27 @ 1230. Will continue to monitor CBC.  3/27 1315 HL subtherapeutic x 1. Increase to 500 units/hr. Will recheck HL in 8 hours.  Thank you for this consult.  Thurman Sarver A. Jordan Hawks, PharmD, BCPS Clinical Pharmacist 02/20/2017

## 2017-02-20 NOTE — Progress Notes (Signed)
Patient is currently receiving PALLIATIVE services at home. CMRN Levada Dy and attending physician Dr. Posey Pronto made aware. Hospital Palliative consult pending. Flo Shanks RN, BSN, [place] [facility] [place], hospital Liaison  [phone]

## 2017-02-20 NOTE — Progress Notes (Addendum)
[place] at [facility]                                                                                                                                                                                  Patient Demographics   Faith Donovan, is a 83 y.o. female, DOB - 1928-08-27, YFV:[id number]  Admit date - 02/04/2017   Admitting Physician Lance Coon, MD  Outpatient Primary MD for the patient is Marguerita Merles, MD   LOS - 2  Subjective: Patient continues to have lactic acidosis which is severe and also tenderness in the abdomen Patient with previous stroke unable to provide She also has very low urine output  Review of Systems:   CONSTITUTIONAL: unable to provide  Vitals:   Vitals:   02/20/17 1000 02/20/17 1100 02/20/17 1200 02/20/17 1300  BP: 104/60 95/82 103/75 112/63  Pulse: (!) 109 (!) 110    Resp: (!) 7 (!) 23 (!) 23 (!) 22  Temp: 98.6 F (37 C) 98.8 F (37.1 C) 98.8 F (37.1 C) 98.8 F (37.1 C)  TempSrc:  Core (Comment)    SpO2: 96% 95%    Weight:      Height:        Wt Readings from Last 3 Encounters:  02/21/2017 126 lb (57.2 kg)  07/05/15 99 lb 3.2 oz (45 kg)     Intake/Output Summary (Last 24 hours) at 02/20/17 1512 Last data filed at 02/20/17 1154  Gross per 24 hour  Intake           428.65 ml  Output              585 ml  Net          -156.35 ml    Physical Exam:   GENERAL: uncomfortable due to pain HEAD, EYES, EARS, NOSE AND THROAT: Atraumatic, normocephalic.   Pupils equal and reactive to light. Sclerae anicteric. No conjunctival injection. No oro-pharyngeal erythema.  NECK: Supple. There is no jugular venous distention. No bruits, no lymphadenopathy, no thyromegaly.  HEART:irregulr irregular  . No murmurs, no rubs, no clicks.  LUNGS: Clear to auscultation bilaterally. No rales or rhonchi. No wheezes.  ABDOMEN: Soft, mildly distended, decreased bowel sounds EXTREMITIES: No evidence of any cyanosis, clubbing, or peripheral  edema.  +2 pedal and radial pulses bilaterally.  NEUROLOGIC: confused SKIN: Moist and warm with no rashes appreciated.  Psych:confused LN: No inguinal LN enlargement    Antibiotics   Anti-infectives    Start     Dose/Rate Route Frequency Ordered Stop   02/20/17 0600  metroNIDAZOLE (FLAGYL) IVPB 500 mg  Status:  Discontinued     500 mg 100  mL/hr over 60 Minutes Intravenous Every 8 hours 02/20/17 0552 02/20/17 0844   02/09/2017 0000  ceFAZolin (ANCEF) IVPB 1 g/50 mL premix    Comments:  Send with pt to OR   1 g 100 mL/hr over 30 Minutes Intravenous  Once 02/17/2017 2358 02/21/2017 1140      Medications   Scheduled Meds: . chlorhexidine  15 mL Mouth Rinse BID  . levothyroxine  50 mcg Oral QAC breakfast  . mouth rinse  15 mL Mouth Rinse q12n4p  . metoprolol  5 mg Intravenous Q6H  . pantoprazole  40 mg Oral Daily  . pravastatin  40 mg Oral q1800  . sodium chloride flush  3 mL Intravenous Q12H   Continuous Infusions: . heparin 450 Units/hr (02/20/17 0444)  .  sodium bicarbonate (isotonic) infusion in sterile water 75 mL/hr at 02/20/17 1100   PRN Meds:.acetaminophen **OR** acetaminophen, diphenhydrAMINE, HYDROcodone-acetaminophen, ondansetron **OR** ondansetron (ZOFRAN) IV   Data Review:   Micro Results Recent Results (from the past 240 hour(s))  Surgical PCR screen     Status: Abnormal   Collection Time: 02/21/2017  5:43 AM  Result Value Ref Range Status   MRSA, PCR NEGATIVE NEGATIVE Final   Staphylococcus aureus POSITIVE (A) NEGATIVE Final    Comment:        The Xpert SA Assay (FDA approved for NASAL specimens in patients over 26 years of age), is one component of a comprehensive surveillance program.  Test performance has been validated by [facility] for patients greater than or equal to 1 year old. It is not intended to diagnose infection nor to guide or monitor treatment.     Radiology Reports Ct Abdomen Pelvis Wo Contrast  Result Date: 02/20/2017 CLINICAL  DATA:  Lower extremity pain and numbness. Distal aortic occlusion. EXAM: CT ABDOMEN AND PELVIS WITHOUT CONTRAST TECHNIQUE: Multidetector CT imaging of the abdomen and pelvis was performed following the standard protocol without IV contrast. COMPARISON:  CTA 02/04/2017 FINDINGS: Lower chest: Mild increase in LEFT pleural effusion. Bibasilar atelectasis. Hepatobiliary: Non IV contrast imaging demonstrates new new hepatic lesion. Low-density lesion in the LEFT hepatic lobe has simple fluid attenuation. Small lesion in the RIGHT hepatic lobe (image 27, series 2 likely represent partially calcified hemangioma. Vicarious excretion of contrast into the gallbladder. Pancreas: Pancreas is normal. No ductal dilatation. No pancreatic inflammation. Spleen: Normal spleen Adrenals/urinary tract: There is retention of IV contrast from CT exam 2 days prior the consistent with hypotension renal insufficiency bilateral renal cysts. Ureters normal. Contrast within the bladder. Bladder collapse from Foley catheter. Stomach/Bowel: Stomach, small-bowel cecum normal. There is mild bowel wall edema associated the ascending and proximal transverse colon. The bowel wall thickening of the descending colon sigmoid colon and rectum. The milder in the rectum diverticulosis of the sigmoid. Moderate amount free fluid along the ascending colon and in the pelvis. No evidence pneumatosis portal venous gas. Vascular/Lymphatic: Heavy intimal calcification abdominal aorta. No aneurysm Reproductive: Post hysterectomy Other: Moderate free fluid Musculoskeletal: No aggressive osseous lesion. IMPRESSION: 1. Edema associated with the ascending transverse colon and to lesser degree the descending and sigmoid colon consistent with pancolitis. Consider ischemic colitis with the abdominal aortic obstruction seen on comparison CTA exam. No pneumatosis or portal venous gas. 2. Increase in intraperitoneal free fluid along the pericolic gutters within the pelvis may  relate to bowel pathology or renal insufficiency 3. Retention of IV contrast the kidneys consistent with with severe renal insufficiency and hypertension 4. Increase in LEFT pleural effusion. Bibasilar atelectasis  additionally. These results will be called to the ordering clinician or representative by the Radiologist Assistant, and communication documented in the PACS or zVision Dashboard. Electronically Signed   By: Suzy Bouchard M.D.   On: 02/20/2017 11:44   Ct Angio Ao+bifem W & Or Wo Contrast  Result Date: 02/07/2017 CLINICAL DATA:  Loss of bilateral pedal pulses, with cold right foot and tibia, and pale skin at the left lower extremity. Initial encounter. EXAM: CT ANGIOGRAPHY OF ABDOMINAL AORTA WITH ILIOFEMORAL RUNOFF TECHNIQUE: Multidetector CT imaging of the abdomen, pelvis and lower extremities was performed using the standard protocol during bolus administration of intravenous contrast. Multiplanar CT image reconstructions and MIPs were obtained to evaluate the vascular anatomy. CONTRAST:  100 mL of Isovue 370 IV contrast COMPARISON:  CT of the abdomen and pelvis performed 07/05/2015 FINDINGS: VASCULAR Aorta: There is acute occlusion of the distal abdominal aorta just below the origin of the mesenteric artery, extending to the bifurcations of the common iliac arteries bilaterally. The external and internal iliac arteries demonstrate reconstitution, likely secondary to collateral vessels along the abdominal wall and within the peritoneum. Scattered calcification is seen along the abdominal aorta and its branches. Celiac: There is slight narrowing along the celiac trunk. SMA: There appears to be occlusion of one of the branches of the superior mesenteric artery, though it reconstitutes distally. Vague surrounding mesenteric edema is seen. No definite ischemic bowel is seen at this time. Renals: The renal arteries are diminutive but grossly patent bilaterally. IMA: The inferior mesenteric artery  remains patent. RIGHT Lower Extremity Inflow: As described above, there is occlusion of the right common iliac artery. There is reconstitution of the right external and internal iliac arteries. Minimal narrowing is suggested along the right common femoral artery. Outflow: The profunda femoris artery appears patent. The superficial femoral artery is only minimally opacified, reflecting more proximal occlusion. Underlying narrowing cannot be excluded. The popliteal artery is grossly intact, though difficult to characterize. Runoff: Runoff to the right ankle is not well characterized due to more proximal occlusion. Flow is noted within the anterior tibial artery. LEFT Lower Extremity Inflow: As described above, there is occlusion of the left common iliac artery. There is reconstitution of the left external and internal iliac arteries. Minimal narrowing is suggested along the left common femoral artery. Outflow: The profunda femoris artery appears intact. The superficial femoral artery is only minimally opacified, reflecting more proximal collision. Underlying narrowing cannot be excluded. The popliteal artery is not well characterized, though grossly intact. Runoff: Runoff to the left ankle is not well characterized due to more proximal occlusion. There is suggestion of two-vessel runoff to the level of the left calf. Veins: The visualized venous structures are grossly unremarkable, though difficult to fully assess given the phase of contrast enhancement. Review of the MIP images confirms the above findings. NON-VASCULAR Lower chest: Small mildly loculated bilateral pleural effusions are noted. Underlying atelectasis is noted. There is marked enlargement of the right side of the heart, particularly at the right atrium, concerning for right heart insufficiency. There is reflux of contrast into the hepatic veins and IVC. Hepatobiliary: A 2.4 cm hypodensity is noted at the medial right hepatic lobe. The liver is otherwise  grossly unremarkable. Small stones are seen within the gallbladder. The gallbladder is otherwise unremarkable. The common bile duct remains normal in caliber. Pancreas: The pancreas is within normal limits. Spleen: The spleen is unremarkable in appearance. Adrenals/Urinary Tract: The adrenal glands are unremarkable in appearance. Right renal cysts are  noted. There is no evidence of hydronephrosis. No renal or ureteral stones are identified. No significant perinephric stranding is seen. Stomach/Bowel: The stomach is unremarkable in appearance. The small bowel is within normal limits. The appendix is not visualized; there is no evidence for appendicitis. Diffuse diverticulosis is noted along the sigmoid colon, without evidence of diverticulitis. Lymphatic: No retroperitoneal or pelvic sidewall lymphadenopathy is seen. Reproductive: Mild soft tissue inflammation about the bladder could reflect cystitis. The patient is status post hysterectomy. No suspicious adnexal masses are seen. Other: Prominent varices are seen tracking about the vagina and within the perineal soft tissues, extending to the labia. Musculoskeletal: No acute osseous abnormalities are identified. Chronic compression deformities are seen at L1 and L3. The visualized musculature is unremarkable in appearance. Diffuse soft tissue edema is seen along the lower legs and feet bilaterally. No knee joint effusions are seen. There is mild atrophy of the distal thigh musculature bilaterally. IMPRESSION: VASCULAR 1. Acute occlusion of the distal abdominal aorta and common iliac arteries, from just below the level of the origin of the inferior mesenteric artery, to the bifurcations of the common iliac arteries bilaterally. There is reconstitution of the external and internal iliac arteries bilaterally, likely secondary to collateral vessels along the abdominal wall and within the peritoneum. 2. Occlusion of one of the branches of the superior mesenteric artery,  though it reconstitutes distally. Vague surrounding mesenteric edema seen. No definite evidence for ischemic bowel at this time. This is likely relatively acute in nature. 3. Runoff to the lower extremities is not well assessed due to more proximal occlusion. There is perhaps 1-vessel runoff to the level of the right calf, and two-vessel runoff to the level of the left calf. 4. Minimal narrowing suggested along the common femoral arteries bilaterally. The superficial femoral arteries are difficult to fully assess. 5. Slight narrowing of the celiac trunk. 6. Scattered aortic atherosclerosis. 7. Prominent varices noted tracking about the vagina and within the perineal soft tissues, extending to the labia. NON-VASCULAR 1. Marked enlargement of the right side of the heart, particularly at the right atrium, concerning for right heart insufficiency. Reflux of contrast into the hepatic veins and IVC. 2. Mild soft tissue inflammation about the bladder could reflect cystitis. 3. Small mildly loculated bilateral pleural effusions, with underlying atelectasis. 4. Diffuse soft tissue edema along the lower legs and feet bilaterally. 5. Cholelithiasis.  Gallbladder otherwise unremarkable. 6. 2.4 cm nonspecific hypodensity at the medial right hepatic lobe. 7. Right renal cysts noted. 8. Diffuse diverticulosis along the sigmoid colon, without evidence of diverticulitis. 9. Chronic compression deformities at L1 and L3. 10. Mild atrophy of the distal thigh musculature bilaterally. Critical Value/emergent results were called by telephone at the time of interpretation on 02/09/2017 at 8:57 pm to Dr. Conni Slipper, who verbally acknowledged these results. Electronically Signed   By: Garald Balding M.D.   On: 01/28/2017 21:32   Dg Chest Port 1 View  Result Date: 02/20/2017 CLINICAL DATA:  Cough. EXAM: PORTABLE CHEST 1 VIEW COMPARISON:  Chest radiograph 07/05/2015, lung bases from abdominal CT 02/17/2017 FINDINGS: Moderate cardiomegaly.  Atherosclerosis of the thoracic aorta. Bilateral pleural effusions, larger on the right which is likely partially loculated. No pulmonary edema. Probable compressive atelectasis in the lower lobes. No pneumothorax. Bones are under mineralized. IMPRESSION: Cardiomegaly with moderate bilateral pleural effusions. Right pleural effusion is likely partially loculated. Thoracic aortic atherosclerosis. Electronically Signed   By: Jeb Levering M.D.   On: 02/20/2017 00:31     CBC  Recent Labs Lab 02/14/2017 1937 02/24/2017 0534 02/20/17 0358  WBC 6.4 7.1 20.9*  HGB 14.6 13.9 12.2  HCT 43.3 41.5 37.6  PLT 180 148* 149*  MCV 100.9* 102.4* 103.2*  MCH 34.0 34.2* 33.5  MCHC 33.7 33.4 32.4  RDW 16.9* 17.2* 17.7*  LYMPHSABS 1.0  --   --   MONOABS 0.6  --   --   EOSABS 0.0  --   --   BASOSABS 0.1  --   --     Chemistries   Recent Labs Lab 02/10/2017 1937 02/12/2017 0534 02/03/2017 2257 02/20/17 1315  NA 142 144 146* 146*  K 3.2* 3.6 3.7 3.2*  CL 105 108 111 109  CO2 27 27 20* 22  GLUCOSE 147* 115* 154* 167*  BUN 24* 21* 24* 28*  CREATININE 1.25* 1.04* 1.35* 1.47*  CALCIUM 8.4* 8.1* 7.8* 7.5*  AST  --   --   --  82*  ALT  --   --   --  22  ALKPHOS  --   --   --  60  BILITOT  --   --   --  1.7*   ------------------------------------------------------------------------------------------------------------------ estimated creatinine clearance is 24.7 mL/min (A) (by C-G formula based on SCr of 1.47 mg/dL (H)). ------------------------------------------------------------------------------------------------------------------ No results for input(s): HGBA1C in the last 72 hours. ------------------------------------------------------------------------------------------------------------------ No results for input(s): CHOL, HDL, LDLCALC, TRIG, CHOLHDL, LDLDIRECT in the last 72  hours. ------------------------------------------------------------------------------------------------------------------ No results for input(s): TSH, T4TOTAL, T3FREE, THYROIDAB in the last 72 hours.  Invalid input(s): FREET3 ------------------------------------------------------------------------------------------------------------------ No results for input(s): VITAMINB12, FOLATE, FERRITIN, TIBC, IRON, RETICCTPCT in the last 72 hours.  Coagulation profile  Recent Labs Lab 02/19/2017 1937  INR 1.14    No results for input(s): DDIMER in the last 72 hours.  Cardiac Enzymes No results for input(s): CKMB, TROPONINI, MYOGLOBIN in the last 168 hours.  Invalid input(s): CK ------------------------------------------------------------------------------------------------------------------ Invalid input(s): [place]  Patient is 83 year old presented with bilateral lower extremity ischemia and aortoiliac occlusion 1. Aortic occlusion (HCC) -  s/p thrombolytic therapy continue IV heparin 2. Abdominal pain due to pan colitis due to ischemia supportive care,  3   oliguric renal failure due to ATN continue supportive care nephrology consult 4. HLD (hyperlipidemia) - continue  Mevacor if able to take 5.  Hypothyroidism - continue Syntroid 6.  GERD (gastroesophageal reflux disease) - home dose PPI 7. Code status: DNR, I have discussed with the son about her poor prognosis. Patient previously was seen by palliative care and hospice followed her and then they subsequently dismissed her. With her current ischemic gut and ATN prog poor.      Code Status Orders        Start     Ordered   02/13/2017 2358  Do not attempt resuscitation (DNR)  Continuous    Question Answer Comment  In the event of cardiac or respiratory ARREST Do not call a "code blue"   In the event of cardiac or respiratory ARREST Do not perform Intubation, CPR, defibrillation or ACLS   In the event of cardiac  or respiratory ARREST Use medication by any route, position, wound care, and other measures to relive pain and suffering. May use oxygen, suction and manual treatment of airway obstruction as needed for comfort.      02/03/2017 2357    Code Status History    Date Active Date Inactive Code Status Order ID Comments User Context   This patient has a current  code status but no historical code status.           Consults  vascular  DVT Prophylaxis  heparin  Lab Results  Component Value Date   PLT 149 (L) 02/20/2017     Time Spent in minutes   35 min critical care pt now with ischemic gut and ARF crtically ILL Dustin Flock M.D on 02/20/2017 at 3:12 PM  Between 7am to 6pm - Pager - [phone]  After 6pm go to [email or web address] - password [email or web address] [facility]   Office  [phone]

## 2017-02-20 NOTE — Progress Notes (Signed)
Called son updated on seriousness of condition, if no improvement in condition will need to consider palliative care.

## 2017-02-21 DIAGNOSIS — N179 Acute kidney failure, unspecified: Secondary | ICD-10-CM

## 2017-02-21 DIAGNOSIS — R52 Pain, unspecified: Secondary | ICD-10-CM

## 2017-02-21 DIAGNOSIS — I7410 Embolism and thrombosis of unspecified parts of aorta: Secondary | ICD-10-CM

## 2017-02-21 DIAGNOSIS — I7409 Other arterial embolism and thrombosis of abdominal aorta: Secondary | ICD-10-CM

## 2017-02-21 DIAGNOSIS — Z515 Encounter for palliative care: Secondary | ICD-10-CM

## 2017-02-21 DIAGNOSIS — I700 Atherosclerosis of aorta: Secondary | ICD-10-CM

## 2017-02-21 LAB — CBC
HCT: 32.8 % — ABNORMAL LOW (ref 35.0–47.0)
Hemoglobin: 11.1 g/dL — ABNORMAL LOW (ref 12.0–16.0)
MCH: 34 pg (ref 26.0–34.0)
MCHC: 33.8 g/dL (ref 32.0–36.0)
MCV: 100.5 fL — AB (ref 80.0–100.0)
PLATELETS: 124 10*3/uL — AB (ref 150–440)
RBC: 3.27 MIL/uL — AB (ref 3.80–5.20)
RDW: 17 % — ABNORMAL HIGH (ref 11.5–14.5)
WBC: 18.3 10*3/uL — ABNORMAL HIGH (ref 3.6–11.0)

## 2017-02-21 LAB — BASIC METABOLIC PANEL
ANION GAP: 12 (ref 5–15)
BUN: 34 mg/dL — ABNORMAL HIGH (ref 6–20)
CO2: 29 mmol/L (ref 22–32)
Calcium: 6.9 mg/dL — ABNORMAL LOW (ref 8.9–10.3)
Chloride: 104 mmol/L (ref 101–111)
Creatinine, Ser: 1.65 mg/dL — ABNORMAL HIGH (ref 0.44–1.00)
GFR calc Af Amer: 31 mL/min — ABNORMAL LOW (ref >60)
GFR, EST NON AFRICAN AMERICAN: 27 mL/min — AB (ref >60)
GLUCOSE: 92 mg/dL (ref 65–99)
Potassium: 3 mmol/L — ABNORMAL LOW (ref 3.5–5.1)
Sodium: 145 mmol/L (ref 135–145)

## 2017-02-21 LAB — HEPARIN LEVEL (UNFRACTIONATED): HEPARIN UNFRACTIONATED: 0.31 [IU]/mL (ref 0.30–0.70)

## 2017-02-21 MED ORDER — MORPHINE SULFATE (PF) 2 MG/ML IV SOLN
2.0000 mg | INTRAVENOUS | Status: DC | PRN
  Administered 2017-02-21 (×4): 2 mg via INTRAVENOUS
  Filled 2017-02-21 (×4): qty 1

## 2017-02-21 MED ORDER — MORPHINE SULFATE (PF) 2 MG/ML IV SOLN
2.0000 mg | INTRAVENOUS | Status: DC | PRN
  Administered 2017-02-21: 2 mg via INTRAVENOUS
  Filled 2017-02-21: qty 1

## 2017-02-21 MED ORDER — LORAZEPAM 2 MG/ML IJ SOLN: 1.0000 mg | INTRAMUSCULAR | Status: DC | PRN

## 2017-02-21 MED ORDER — GLYCOPYRROLATE 0.2 MG/ML IJ SOLN: 0.2000 mg | INTRAMUSCULAR | Status: DC | PRN

## 2017-02-21 NOTE — Progress Notes (Signed)
Subjective:   Patient remains critically ill and minimally interactive Serum creatinine further increased to 1.65 Urine output remains low at 375 over the last 24 hours  Objective:  Vital signs in last 24 hours:  Temp:  [98.6 F (37 C)-99.5 F (37.5 C)] 99 F (37.2 C) (03/28 1200) Pulse Rate:  [101-125] 120 (03/28 1200) Resp:  [13-28] 21 (03/28 1200) BP: (79-112)/(33-90) 98/71 (03/28 1200) SpO2:  [87 %-100 %] 87 % (03/28 1200)  Weight change:  Filed Weights   01/29/2017 1917 02/20/2017 0017 02/21/2017 0802  Weight: 54.4 kg (120 lb) 57.5 kg (126 lb 11.2 oz) 57.2 kg (126 lb)    Intake/Output:    Intake/Output Summary (Last 24 hours) at 02/21/17 1239 Last data filed at 02/21/17 1200  Gross per 24 hour  Intake          1926.58 ml  Output              200 ml  Net          1726.58 ml     Physical Exam: General: Critically ill-appearing   HEENT Right facial droop   Neck Distended neck veins   Pulm/lungs Shallow effort, basilar crackles  CVS/Heart irregular  Abdomen:  Soft, Severe tenderness to palpation  Extremities: Trace dependent edema  Neurologic: Lethargic, minimally interactive   Skin: Decreased turgor           Basic Metabolic Panel:   Recent Labs Lab 02/12/2017 1937 02/18/2017 0534 02/11/2017 2257 02/20/17 1315 02/21/17 0841  NA 142 144 146* 146* 145  K 3.2* 3.6 3.7 3.2* 3.0*  CL 105 108 111 109 104  CO2 27 27 20* 22 29  GLUCOSE 147* 115* 154* 167* 92  BUN 24* 21* 24* 28* 34*  CREATININE 1.25* 1.04* 1.35* 1.47* 1.65*  CALCIUM 8.4* 8.1* 7.8* 7.5* 6.9*     CBC:  Recent Labs Lab 01/31/2017 1937 02/17/2017 0534 02/20/17 0358 02/21/17 0841  WBC 6.4 7.1 20.9* 18.3*  NEUTROABS 4.7  --   --   --   HGB 14.6 13.9 12.2 11.1*  HCT 43.3 41.5 37.6 32.8*  MCV 100.9* 102.4* 103.2* 100.5*  PLT 180 148* 149* 124*      Microbiology:  Recent Results (from the past 720 hour(s))  Surgical PCR screen     Status: Abnormal   Collection Time: 02/09/2017  5:43 AM   Result Value Ref Range Status   MRSA, PCR NEGATIVE NEGATIVE Final   Staphylococcus aureus POSITIVE (A) NEGATIVE Final    Comment:        The Xpert SA Assay (FDA approved for NASAL specimens in patients over 54 years of age), is one component of a comprehensive surveillance program.  Test performance has been validated by [facility] for patients greater than or equal to 60 year old. It is not intended to diagnose infection nor to guide or monitor treatment.     Coagulation Studies:  Recent Labs  01/28/2017 1937  LABPROT 14.7  INR 1.14    Urinalysis: No results for input(s): COLORURINE, LABSPEC, PHURINE, GLUCOSEU, HGBUR, BILIRUBINUR, KETONESUR, PROTEINUR, UROBILINOGEN, NITRITE, LEUKOCYTESUR in the last 72 hours.  Invalid input(s): APPERANCEUR    Imaging: Ct Abdomen Pelvis Wo Contrast  Result Date: 02/20/2017 CLINICAL DATA:  Lower extremity pain and numbness. Distal aortic occlusion. EXAM: CT ABDOMEN AND PELVIS WITHOUT CONTRAST TECHNIQUE: Multidetector CT imaging of the abdomen and pelvis was performed following the standard protocol without IV contrast. COMPARISON:  CTA 02/21/2017 FINDINGS: Lower chest: Mild increase  in LEFT pleural effusion. Bibasilar atelectasis. Hepatobiliary: Non IV contrast imaging demonstrates new new hepatic lesion. Low-density lesion in the LEFT hepatic lobe has simple fluid attenuation. Small lesion in the RIGHT hepatic lobe (image 27, series 2 likely represent partially calcified hemangioma. Vicarious excretion of contrast into the gallbladder. Pancreas: Pancreas is normal. No ductal dilatation. No pancreatic inflammation. Spleen: Normal spleen Adrenals/urinary tract: There is retention of IV contrast from CT exam 2 days prior the consistent with hypotension renal insufficiency bilateral renal cysts. Ureters normal. Contrast within the bladder. Bladder collapse from Foley catheter. Stomach/Bowel: Stomach, small-bowel cecum normal. There is mild bowel wall  edema associated the ascending and proximal transverse colon. The bowel wall thickening of the descending colon sigmoid colon and rectum. The milder in the rectum diverticulosis of the sigmoid. Moderate amount free fluid along the ascending colon and in the pelvis. No evidence pneumatosis portal venous gas. Vascular/Lymphatic: Heavy intimal calcification abdominal aorta. No aneurysm Reproductive: Post hysterectomy Other: Moderate free fluid Musculoskeletal: No aggressive osseous lesion. IMPRESSION: 1. Edema associated with the ascending transverse colon and to lesser degree the descending and sigmoid colon consistent with pancolitis. Consider ischemic colitis with the abdominal aortic obstruction seen on comparison CTA exam. No pneumatosis or portal venous gas. 2. Increase in intraperitoneal free fluid along the pericolic gutters within the pelvis may relate to bowel pathology or renal insufficiency 3. Retention of IV contrast the kidneys consistent with with severe renal insufficiency and hypertension 4. Increase in LEFT pleural effusion. Bibasilar atelectasis additionally. These results will be called to the ordering clinician or representative by the Radiologist Assistant, and communication documented in the PACS or zVision Dashboard. Electronically Signed   By: Suzy Bouchard M.D.   On: 02/20/2017 11:44   Dg Chest Port 1 View  Result Date: 02/20/2017 CLINICAL DATA:  Cough. EXAM: PORTABLE CHEST 1 VIEW COMPARISON:  Chest radiograph 07/05/2015, lung bases from abdominal CT 02/10/2017 FINDINGS: Moderate cardiomegaly. Atherosclerosis of the thoracic aorta. Bilateral pleural effusions, larger on the right which is likely partially loculated. No pulmonary edema. Probable compressive atelectasis in the lower lobes. No pneumothorax. Bones are under mineralized. IMPRESSION: Cardiomegaly with moderate bilateral pleural effusions. Right pleural effusion is likely partially loculated. Thoracic aortic atherosclerosis.  Electronically Signed   By: Jeb Levering M.D.   On: 02/20/2017 00:31     Medications:   . heparin 500 Units/hr (02/20/17 1444)  .  sodium bicarbonate (isotonic) infusion in sterile water 75 mL/hr at 02/21/17 0022   . chlorhexidine  15 mL Mouth Rinse BID  . levothyroxine  50 mcg Oral QAC breakfast  . mouth rinse  15 mL Mouth Rinse q12n4p  . metoprolol  5 mg Intravenous Q6H  . sodium chloride flush  3 mL Intravenous Q12H   acetaminophen **OR** acetaminophen, diphenhydrAMINE, HYDROcodone-acetaminophen, morphine injection, ondansetron **OR** ondansetron (ZOFRAN) IV  Assessment/ Plan:  83 y.o. 83 y.o. caucasian female  was admitted on 02/13/2017  Patient has critical illness.  She presented with limb ischemia and aortic occlusion that was diagnosed on a CT angiogram requiring IV contrast exposure.  Subsequently, she underwent thrombolytic therapy and angioplasty for revascularization.  Repeat CT showed changes consistent with ischemic colitis.  Patient has elevated lactic acid, severe acidosis. Other findings include moderate right sided cardiomegaly suggesting rt heart failure She has developed severe acute kidney injury and is oliguric Serum creatinine and BUN are rapidly increasing Potassium is low at 3.0  Due to severe critical illness and multiorgan failure, patient's prognosis is extremely poor.  No indication of HD at present Palliative care evaluation is ongoing.     LOS: 3 Titania Gault 3/28/201812:39 PM

## 2017-02-21 NOTE — Progress Notes (Signed)
Pt comfort care, waiting on bed on 1C, admit to hospice home tomorrow.

## 2017-02-21 NOTE — Progress Notes (Signed)
Dr Lucky Cowboy in to check on pt, palliative care also here, awaiting son's arrival.

## 2017-02-21 NOTE — Progress Notes (Signed)
Called son to update the condition , told him pt with multiorgan failure,  And prognosis is poor and strongly recommended comfort care. He states he will be here at lunch at that time will discuss with him regarding further care.

## 2017-02-21 NOTE — Progress Notes (Signed)
[facility]  Daily Progress Note   Subjective  - 2 Days Post-Op  Patient remains critically ill, unable to provide much history.  Seems in pain.  Objective Vitals:   02/21/17 1000 02/21/17 1100 02/21/17 1200 02/21/17 1300  BP: (!) 95/58 96/78 98/71  (!) 91/54  Pulse: (!) 125 (!) 102 (!) 120 (!) 103  Resp: (!) 21 (!) 21 (!) 21 20  Temp: 99 F (37.2 C) 99 F (37.2 C) 99 F (37.2 C) 98.8 F (37.1 C)  TempSrc:      SpO2: (!) 89% 94% (!) 87% 92%  Weight:      Height:        Intake/Output Summary (Last 24 hours) at 02/21/17 1343 Last data filed at 02/21/17 1300  Gross per 24 hour  Intake          2006.58 ml  Output              200 ml  Net          1806.58 ml    PULM  Coarse and diminished bilaterally CV  Tachycardic and irregular ABD     Distended and tender to mild palpation VASC  Feet are actually pretty warm with good doppler signal on the left and poor signal on the right SKIN    Access site C/D/I with mild bruising   Laboratory CBC    Component Value Date/Time   WBC 18.3 (H) 02/21/2017 0841   HGB 11.1 (L) 02/21/2017 0841   HGB 12.8 03/10/2014 0643   HCT 32.8 (L) 02/21/2017 0841   HCT 39.5 03/10/2014 0643   PLT 124 (L) 02/21/2017 0841   PLT 163 03/10/2014 0643    BMET    Component Value Date/Time   NA 145 02/21/2017 0841   NA 142 03/10/2014 0643   K 3.0 (L) 02/21/2017 0841   K 3.8 03/10/2014 0643   CL 104 02/21/2017 0841   CL 102 03/10/2014 0643   CO2 29 02/21/2017 0841   CO2 32 03/10/2014 0643   GLUCOSE 92 02/21/2017 0841   GLUCOSE 61 (L) 03/10/2014 0643   BUN 34 (H) 02/21/2017 0841   BUN 12 03/10/2014 0643   CREATININE 1.65 (H) 02/21/2017 0841   CREATININE 0.90 03/10/2014 0643   CALCIUM 6.9 (L) 02/21/2017 0841   CALCIUM 8.9 03/10/2014 0643   GFRNONAA 27 (L) 02/21/2017 0841   GFRNONAA 59 (L) 03/10/2014 0643   GFRAA 31 (L) 02/21/2017 0841   GFRAA >60 03/10/2014 0643    Assessment/Planning: POD #2 s/p BLE  revascularization for acute ischemia secondary to embolization   Her LE perfusion appears to be intact, but her hypotension, tachycardia and low overall perfusion persist.    Ischemic colitis appears present now and high lactate would suggest intestinal gangrene is possible  Overall prognosis appears very poor with renal failure, and multiple other ongoing issues  No vascular intervention likely of benefit at this point  Will follow    Leotis Pain  02/21/2017, 1:43 PM

## 2017-02-21 NOTE — Care Management (Addendum)
Patient's son is to come today and meet with palliative care around noon per attending to discuss goals of care

## 2017-02-21 NOTE — Progress Notes (Signed)
ANTICOAGULATION CONSULT NOTE - Initial Consult  Pharmacy Consult for heparin drip  Indication: acute aortic / iliac occlusion. arterial thrombosis       Allergies  Allergen Reactions  . Aspirin Other (See Comments)    Reaction: Unknown     Patient Measurements: Height: 5\' 5"  (165.1 cm) Weight: 126 lb (57.2 kg) IBW/kg (Calculated) : 57  Vital Signs: BP: 119/99 (03/26 2022) Pulse Rate: 112 (03/26 1945)  Labs:  Recent Labs (last 2 labs)    Recent Labs  02/14/2017 1937 02/18/2017 0534  HGB 14.6 13.9  HCT 43.3 41.5  PLT 180 148*  APTT 28 --   LABPROT 14.7 --   INR 1.14 --   HEPARINUNFRC --  1.00*  CREATININE 1.25* 1.04*      Estimated Creatinine Clearance: 34.9 mL/min (A) (by C-G formula based on SCr of 1.04 mg/dL (H)).   Medical History:     Past Medical History:  Diagnosis Date  . Cancer [facility])    brain tumor  . GERD (gastroesophageal reflux disease)   . High cholesterol   . Hypertension   . Hypothyroidism   . Stroke [facility])    Assessment: 83 yo female with acute aortic / iliac occlusion/ arterial thrombosis. Patient has acute BLE ischemia with aortoiliac occlusion. Pharmacy consulted to assist with heparin drip dosing and monitoring.  Patient underwent angiogram this morning with catheter directed thrombolytic therapy with TPA and placement of infusion catheters for continued thrombolytic therapy. Patient had heparin 300 units/hr running intra-arterial with catheters and then went for repeat angiogram this evening.   Spoke with vascular surgeon post-op. Alteplase and heparin 300 units/hr orders discontinued per discussion with MD. Patient is to be resumed on heparin 550 units/hr now and will check HL in 6 hours with a goal of 0.3 - 0.5 (lower end of therapeutic range).  Goal of Therapy: Heparin level 0.3-0.5units/ml Monitor platelets by anticoagulation protocol: Yes  Plan: Continue heparin at current rate and f/u AM  labs.   Ulice Dash, PharmD Clinical Pharmacist  02/21/2017

## 2017-02-21 NOTE — Progress Notes (Signed)
New hospice home referral received from [place] following a Palliative Medicine consult. Mrs. Plath is an 83 year old woman with a known history of dementia, GERD, HTN, Hyperthyroidism and stroke admitted to [facility] on 3/25  with leg pain. CT angio abdomen revealed distal aortoiliac occlusion. Vascular surgery consulted s/p thrombolytic therapy with continuous heparin infusion. She has developed abdominal pain, found to have ischemic bowel as well as oliguria d/t ATN. Palliative Medicine was consulted for goals of care and met with patient's son [name], who has chosen to focus on his mother's comfort with transfer to the hospice home for pain management and end of life care.  Writer met in the room with [name] and a family friend Stanton Kidney. Writer initiated education regarding hospice service, philosophy of care and team approach to care with understanding voiced. Questions answered, consents signed.  Patient seen lying in ed, very difficult to understand when speaking. She reported abdominal pain, staff RN Katie aware. Emotional support provided. Updated notes faxed to referral. Plan is discharge to the hospice home tomorrow 3/28 via EMS. Signed DNR in place in discharge packet. Will continue to follow through final disposition. Thank you. Flo Shanks RN, BSN, [facility] [place], hospital Liaison [phone] c

## 2017-02-21 NOTE — Progress Notes (Signed)
Pt rested comfortably during the night and did not complain of any pain.  Pt remains afib on the monitor.  Heparin drip remains infusing.

## 2017-02-21 NOTE — Progress Notes (Signed)
ANTICOAGULATION CONSULT NOTE - Initial Consult  Pharmacy Consult for heparin drip  Indication: acute aortic / iliac occlusion. arterial thrombosis       Allergies  Allergen Reactions  . Aspirin Other (See Comments)    Reaction: Unknown     Patient Measurements: Height: 5\' 5"  (165.1 cm) Weight: 126 lb (57.2 kg) IBW/kg (Calculated) : 57  Vital Signs: BP: 119/99 (03/26 2022) Pulse Rate: 112 (03/26 1945)  Labs:  Recent Labs (last 2 labs)    Recent Labs  02/13/2017 1937 02/01/2017 0534  HGB 14.6 13.9  HCT 43.3 41.5  PLT 180 148*  APTT 28 --   LABPROT 14.7 --   INR 1.14 --   HEPARINUNFRC --  1.00*  CREATININE 1.25* 1.04*      Estimated Creatinine Clearance: 34.9 mL/min (A) (by C-G formula based on SCr of 1.04 mg/dL (H)).   Medical History:     Past Medical History:  Diagnosis Date  . Cancer [facility])    brain tumor  . GERD (gastroesophageal reflux disease)   . High cholesterol   . Hypertension   . Hypothyroidism   . Stroke [facility])    Assessment: 83 yo female with acute aortic / iliac occlusion/ arterial thrombosis. Patient has acute BLE ischemia with aortoiliac occlusion. Pharmacy consulted to assist with heparin drip dosing and monitoring.  Patient underwent angiogram this morning with catheter directed thrombolytic therapy with TPA and placement of infusion catheters for continued thrombolytic therapy. Patient had heparin 300 units/hr running intra-arterial with catheters and then went for repeat angiogram this evening.   Spoke with vascular surgeon post-op. Alteplase and heparin 300 units/hr orders discontinued per discussion with MD. Patient is to be resumed on heparin 550 units/hr now and will check HL in 6 hours with a goal of 0.3 - 0.5 (lower end of therapeutic range).  Goal of Therapy: Heparin level 0.3-0.5units/ml Monitor platelets by anticoagulation protocol: Yes  Plan: Entered order to resume heparin infusion at  550 units/hr. Will order HL in 6 hours and CBC with AM labs tomorrow.   3/27 @ 0253 HL: 0.97 supratherapeutic. Will hold heparin drip for 1 hour considering patient has had two supratherapeutic levels and has received tPA. Will decrease drip rate to 450 units/hour. Will recheck HL 3/27 @ 1230. Will continue to monitor CBC.  3/27 1315 HL subtherapeutic x 1. Increase to 500 units/hr. Will recheck HL in 8 hours.  3/27 2300 HL: 0.40 therapeutic x 1 will check another level 3/28 @ 0700.  Thank you for this consult.  Tobie Lords, PharmD, BCPS Clinical Pharmacist 02/21/2017

## 2017-02-21 NOTE — Progress Notes (Signed)
SLP Cancellation Note  Patient Details Name: Faith Donovan MRN: [id number] DOB: 1928-08-14   Cancelled treatment:       Reason Eval/Treat Not Completed: Patient declined, no reason specified;Medical issues which prohibited therapy;Patient's level of consciousness (reviewed chart notes; consulted NSG re: pt's status). Per MD note, pt's prognosis is poor. A palliative care consult w/ family is scheduled for noon today. NSG indicated a more comfort care approach at this time. ST services will f/u tomorrow w/ pt's status and need for BSE. NSG agreed. Recommend frequent oral care while NPO.    Orinda Kenner, MS, CCC-SLP [name] 02/21/2017, 12:29 PM

## 2017-02-21 NOTE — Consult Note (Signed)
Consultation Note Date: 02/21/2017   Patient Name: Faith Donovan  DOB: Oct 07, 1928  MRN: [id number]  Age / Sex: 83 y.o., female  PCP: Marguerita Merles, MD Referring Physician: Dustin Flock, MD  Reason for Consultation: Establishing goals of care and Terminal Care  HPI/Patient Profile: 83 y.o. female  with past medical history of CVA, HTN, HLD, GERD, and hypothyroidism admitted on 02/17/2017 with leg pain. CT angio abdomen revealed distal aortoiliac occlusion. Vascular surgery consulted s/p thrombolytic therapy with continuous heparin infusion. Abdominal pain due to ischemic colitis. Oliguric renal failure due to ATN. Severe lactic acidosis 6.8. Multiorgan failure with poor prognosis. Palliative medicine consultation.           Clinical Assessment and Goals of Care: I have reviewed medical records, discussed with care team, and met with son Faith Donovan) and friend Faith Donovan) in family waiting area to discuss diagnosis, prognosis, [place], EOL wishes, disposition and options.  Introduced Palliative Medicine as specialized medical care for people living with serious illness. It focuses on providing relief from the symptoms and stress of a serious illness. The goal is to improve quality of life for both the patient and the family. The patient was followed by hospice recently and was discharged.   We discussed a brief life review of the patient. Husband recently died at the end of 01-22-2023. Faith Donovan is only living child. Prior to disability from stroke, the patient worked at the [employer] and loved to sew. She was married for 65 years.  Faith Donovan speaks of her "living miserably since her stroke." Prior to hospitalization, she was still ambulating with assist and feeding self.      Discussed hospital diagnoses and very poor prognosis with multiorgan failure. They have discussed EOL wishes and son confirms no resuscitation/life support or  life prolonging measures.   The difference between aggressive medical intervention and comfort care was considered in light of the patient's goals of care. [name] and Faith Donovan understand poor prognosis and the shift to comfort. Educated on comfort, quality, dignity for the time she has left. Explained that interventions not aimed at comfort will be discontinued and focus will be symptom management to relieve pain and suffering.    Discussed hospice services and that she is appropriate for residential hospice facility.   Questions and concerns were addressed. Emotional and spiritual support given.   SUMMARY OF RECOMMENDATIONS    DNR/DNI  Comfort measures only. Discontinued labs/interventions/medications not aimed at comfort.   Social work consult for residential hospice placement. Likely transfer to hospice home tomorrow if stable for transfer.   PMT will continue to support patient/family through hospitalization.   Code Status/Advance Care Planning:  DNR   Symptom Management:   Morphine 33m q1h prn pain/dyspnea/air hunger  Ativan 1625mq4h prn anxiety  Robinul 0.25m29m4h prn secretions  Palliative Prophylaxis:   Aspiration, Delirium Protocol, Frequent Pain Assessment, Oral Care and Turn Reposition  Additional Recommendations (Limitations, Scope, Preferences):  Full Comfort Care  Psycho-social/Spiritual:   Desire for further Chaplaincy support:yes  Additional Recommendations:  Caregiving  Support/Resources, Best boy and Education on Hospice  Prognosis:   Hours - Days  Discharge Planning: Hospice facility      Primary Diagnoses: Present on Admission: . Leg pain . HTN (hypertension) . HLD (hyperlipidemia) . Hypothyroidism . GERD (gastroesophageal reflux disease)   I have reviewed the medical record, interviewed the patient and family, and examined the patient. The following aspects are pertinent.  Past Medical History:  Diagnosis Date  .  Cancer [facility])    brain tumor  . GERD (gastroesophageal reflux disease)   . High cholesterol   . Hypertension   . Hypothyroidism   . Stroke [facility])    Social History   Social History  . Marital status: Widowed    Spouse name: N/A  . Number of children: N/A  . Years of education: N/A   Social History Main Topics  . Smoking status: Never Smoker  . Smokeless tobacco: None  . Alcohol use No  . Drug use: No  . Sexual activity: Not Asked   Other Topics Concern  . None   Social History Narrative  . None   Family History  Problem Relation Age of Onset  . Cancer Mother   . Stroke Father    Scheduled Meds: . chlorhexidine  15 mL Mouth Rinse BID  . levothyroxine  50 mcg Oral QAC breakfast  . mouth rinse  15 mL Mouth Rinse q12n4p  . metoprolol  5 mg Intravenous Q6H  . sodium chloride flush  3 mL Intravenous Q12H   Continuous Infusions: . heparin 500 Units/hr (02/20/17 1444)  .  sodium bicarbonate (isotonic) infusion in sterile water 75 mL/hr at 02/21/17 0022   PRN Meds:.acetaminophen **OR** acetaminophen, diphenhydrAMINE, HYDROcodone-acetaminophen, morphine injection, ondansetron **OR** ondansetron (ZOFRAN) IV Medications Prior to Admission:  Prior to Admission medications   Medication Sig Start Date End Date Taking? Authorizing Provider  atenolol (TENORMIN) 50 MG tablet Take 50 mg by mouth 2 (two) times daily.    Yes Historical Provider, MD  furosemide (LASIX) 20 MG tablet Take 20 mg by mouth every other day.   Yes Historical Provider, MD  levothyroxine (SYNTHROID, LEVOTHROID) 50 MCG tablet Take 50 mcg by mouth daily before breakfast.   Yes Historical Provider, MD  lovastatin (MEVACOR) 40 MG tablet Take 40 mg by mouth every evening.    Yes Historical Provider, MD  omeprazole (PRILOSEC) 20 MG capsule Take 20 mg by mouth daily.   Yes Historical Provider, MD  polyethylene glycol (MIRALAX) packet Take 17 g by mouth daily as needed for mild constipation or moderate constipation.  07/05/15   Orbie Pyo, MD  senna-docusate (SENOKOT-S) 8.6-50 MG per tablet Take 1-2 tablets by mouth 2 (two) times daily.    Historical Provider, MD   Allergies  Allergen Reactions  . Aspirin Other (See Comments)    Reaction:  Unknown    Review of Systems  Unable to perform ROS: Acuity of condition   Physical Exam  Constitutional: She appears cachectic. She is easily aroused. She appears ill.  HENT:  Head: Normocephalic and atraumatic.  Cardiovascular: Normal heart sounds.  Tachycardia present.   Pulmonary/Chest: No accessory muscle usage. No tachypnea. She has decreased breath sounds.  Abdominal: There is tenderness.  Neurological: She is easily aroused.  Skin: Skin is warm and dry. There is pallor.  Nursing note and vitals reviewed.  Vital Signs: BP 98/71   Pulse (!) 120   Temp 99 F (37.2 C)   Resp (!) 21   Ht  _0  (1.651 m)   Wt 57.2 kg (126 lb)   SpO2 (!) 87%   BMI 20.97 kg/m  Pain Assessment: 0-10 POSS *See Group Information*: S-Acceptable,Sleep, easy to arouse Pain Score: Asleep   SpO2: SpO2: (!) 87 % O2 Device:SpO2: (!) 87 % O2 Flow Rate: .O2 Flow Rate (L/min): 3 L/min  IO: Intake/output summary:   Intake/Output Summary (Last 24 hours) at 02/21/17 1231 Last data filed at 02/21/17 1100  Gross per 24 hour  Intake          1846.58 ml  Output              200 ml  Net          1646.58 ml   LBM:   Baseline Weight: Weight: 54.4 kg (120 lb) Most recent weight: Weight: 57.2 kg (126 lb)     Palliative Assessment/Data: PPS 10%   Flowsheet Rows     Most Recent Value  Intake Tab  Referral Department  Hospitalist  Unit at Time of Referral  ICU  Palliative Care Primary Diagnosis  Cardiac  Date Notified  02/20/17  Palliative Care Type  New Palliative care  Reason for referral  End of Life Care Assistance  Date of Admission  02/21/2017  Date first seen by Palliative Care  02/21/17  # of days IP prior to Palliative referral  2  Clinical Assessment    Palliative Performance Scale Score  10%  Psychosocial & Spiritual Assessment  Palliative Care Outcomes  Patient/Family meeting held?  Yes  Who was at the meeting?  patient and son  Palliative Care Outcomes  Clarified goals of care, Provided psychosocial or spiritual support, Changed to focus on comfort, Improved pain interventions, Improved non-pain symptom therapy, Provided end of life care assistance      Time In: 1300 Time Out: 1415 Time Total: 37mn Greater than 50%  of this time was spent counseling and coordinating care related to the above assessment and plan.  Signed by:  MIhor Dow FNP-C Palliative Medicine Team  Phone: [phone]: [phone]  Please contact Palliative Medicine Team phone at [phone] questions and concerns.  For individual provider: See AShea Evans

## 2017-02-21 NOTE — Progress Notes (Signed)
[place] at [facility]                                                                                                                                                                                  Patient Demographics   Faith Donovan, is a 83 y.o. female, DOB - Oct 10, 1928, WHQ:[id number]  Admit date - 02/21/2017   Admitting Physician Lance Coon, MD  Outpatient Primary MD for the patient is Marguerita Merles, MD   LOS - 3  Subjective: Pt still very uncomfortable , Blood pressure low.  Review of Systems:   CONSTITUTIONAL: unable to provide  Vitals:   Vitals:   02/21/17 1200 02/21/17 1300 02/21/17 1400 02/21/17 1500  BP: 98/71 (!) 91/54 99/80 102/68  Pulse: (!) 120 (!) 103 (!) 120 (!) 125  Resp: (!) 21 20 (!) 29 18  Temp: 99 F (37.2 C) 98.8 F (37.1 C) 98.6 F (37 C) 98.6 F (37 C)  TempSrc:      SpO2: (!) 87% 92% 94% 92%  Weight:      Height:        Wt Readings from Last 3 Encounters:  02/20/2017 126 lb (57.2 kg)  07/05/15 99 lb 3.2 oz (45 kg)     Intake/Output Summary (Last 24 hours) at 02/21/17 1608 Last data filed at 02/21/17 1520  Gross per 24 hour  Intake          1733.33 ml  Output              250 ml  Net          1483.33 ml    Physical Exam:   GENERAL: uncomfortable due to pain HEAD, EYES, EARS, NOSE AND THROAT: Atraumatic, normocephalic.   Pupils equal and reactive to light. Sclerae anicteric. No conjunctival injection. No oro-pharyngeal erythema.  NECK: Supple. There is no jugular venous distention. No bruits, no lymphadenopathy, no thyromegaly.  HEART:irregulr irregular  . No murmurs, no rubs, no clicks.  LUNGS: Clear to auscultation bilaterally. No rales or rhonchi. No wheezes.  ABDOMEN: Soft, mildly distended, decreased bowel sounds Positive tenderness EXTREMITIES: No evidence of any cyanosis, clubbing, or peripheral edema.  +2 pedal and radial pulses bilaterally.  NEUROLOGIC: confused SKIN: Moist and warm with no  rashes appreciated.  Psych:confused LN: No inguinal LN enlargement    Antibiotics   Anti-infectives    Start     Dose/Rate Route Frequency Ordered Stop   02/20/17 0600  metroNIDAZOLE (FLAGYL) IVPB 500 mg  Status:  Discontinued     500 mg 100 mL/hr over 60 Minutes Intravenous Every 8 hours 02/20/17 0552 02/20/17 0844   02/15/2017 0000  ceFAZolin (ANCEF)  IVPB 1 g/50 mL premix    Comments:  Send with pt to OR   1 g 100 mL/hr over 30 Minutes Intravenous  Once 02/03/2017 2358 01/30/2017 1140      Medications   Scheduled Meds: . chlorhexidine  15 mL Mouth Rinse BID  . mouth rinse  15 mL Mouth Rinse q12n4p  . sodium chloride flush  3 mL Intravenous Q12H   Continuous Infusions:  PRN Meds:.acetaminophen **OR** acetaminophen, diphenhydrAMINE, HYDROcodone-acetaminophen, morphine injection, ondansetron **OR** ondansetron (ZOFRAN) IV   Data Review:   Micro Results Recent Results (from the past 240 hour(s))  Surgical PCR screen     Status: Abnormal   Collection Time: 02/14/2017  5:43 AM  Result Value Ref Range Status   MRSA, PCR NEGATIVE NEGATIVE Final   Staphylococcus aureus POSITIVE (A) NEGATIVE Final    Comment:        The Xpert SA Assay (FDA approved for NASAL specimens in patients over 70 years of age), is one component of a comprehensive surveillance program.  Test performance has been validated by [facility] for patients greater than or equal to 63 year old. It is not intended to diagnose infection nor to guide or monitor treatment.     Radiology Reports Ct Abdomen Pelvis Wo Contrast  Result Date: 02/20/2017 CLINICAL DATA:  Lower extremity pain and numbness. Distal aortic occlusion. EXAM: CT ABDOMEN AND PELVIS WITHOUT CONTRAST TECHNIQUE: Multidetector CT imaging of the abdomen and pelvis was performed following the standard protocol without IV contrast. COMPARISON:  CTA 02/14/2017 FINDINGS: Lower chest: Mild increase in LEFT pleural effusion. Bibasilar atelectasis.  Hepatobiliary: Non IV contrast imaging demonstrates new new hepatic lesion. Low-density lesion in the LEFT hepatic lobe has simple fluid attenuation. Small lesion in the RIGHT hepatic lobe (image 27, series 2 likely represent partially calcified hemangioma. Vicarious excretion of contrast into the gallbladder. Pancreas: Pancreas is normal. No ductal dilatation. No pancreatic inflammation. Spleen: Normal spleen Adrenals/urinary tract: There is retention of IV contrast from CT exam 2 days prior the consistent with hypotension renal insufficiency bilateral renal cysts. Ureters normal. Contrast within the bladder. Bladder collapse from Foley catheter. Stomach/Bowel: Stomach, small-bowel cecum normal. There is mild bowel wall edema associated the ascending and proximal transverse colon. The bowel wall thickening of the descending colon sigmoid colon and rectum. The milder in the rectum diverticulosis of the sigmoid. Moderate amount free fluid along the ascending colon and in the pelvis. No evidence pneumatosis portal venous gas. Vascular/Lymphatic: Heavy intimal calcification abdominal aorta. No aneurysm Reproductive: Post hysterectomy Other: Moderate free fluid Musculoskeletal: No aggressive osseous lesion. IMPRESSION: 1. Edema associated with the ascending transverse colon and to lesser degree the descending and sigmoid colon consistent with pancolitis. Consider ischemic colitis with the abdominal aortic obstruction seen on comparison CTA exam. No pneumatosis or portal venous gas. 2. Increase in intraperitoneal free fluid along the pericolic gutters within the pelvis may relate to bowel pathology or renal insufficiency 3. Retention of IV contrast the kidneys consistent with with severe renal insufficiency and hypertension 4. Increase in LEFT pleural effusion. Bibasilar atelectasis additionally. These results will be called to the ordering clinician or representative by the Radiologist Assistant, and communication  documented in the PACS or zVision Dashboard. Electronically Signed   By: Suzy Bouchard M.D.   On: 02/20/2017 11:44   Ct Angio Ao+bifem W & Or Wo Contrast  Result Date: 02/21/2017 CLINICAL DATA:  Loss of bilateral pedal pulses, with cold right foot and tibia, and pale skin at the  left lower extremity. Initial encounter. EXAM: CT ANGIOGRAPHY OF ABDOMINAL AORTA WITH ILIOFEMORAL RUNOFF TECHNIQUE: Multidetector CT imaging of the abdomen, pelvis and lower extremities was performed using the standard protocol during bolus administration of intravenous contrast. Multiplanar CT image reconstructions and MIPs were obtained to evaluate the vascular anatomy. CONTRAST:  100 mL of Isovue 370 IV contrast COMPARISON:  CT of the abdomen and pelvis performed 07/05/2015 FINDINGS: VASCULAR Aorta: There is acute occlusion of the distal abdominal aorta just below the origin of the mesenteric artery, extending to the bifurcations of the common iliac arteries bilaterally. The external and internal iliac arteries demonstrate reconstitution, likely secondary to collateral vessels along the abdominal wall and within the peritoneum. Scattered calcification is seen along the abdominal aorta and its branches. Celiac: There is slight narrowing along the celiac trunk. SMA: There appears to be occlusion of one of the branches of the superior mesenteric artery, though it reconstitutes distally. Vague surrounding mesenteric edema is seen. No definite ischemic bowel is seen at this time. Renals: The renal arteries are diminutive but grossly patent bilaterally. IMA: The inferior mesenteric artery remains patent. RIGHT Lower Extremity Inflow: As described above, there is occlusion of the right common iliac artery. There is reconstitution of the right external and internal iliac arteries. Minimal narrowing is suggested along the right common femoral artery. Outflow: The profunda femoris artery appears patent. The superficial femoral artery is only  minimally opacified, reflecting more proximal occlusion. Underlying narrowing cannot be excluded. The popliteal artery is grossly intact, though difficult to characterize. Runoff: Runoff to the right ankle is not well characterized due to more proximal occlusion. Flow is noted within the anterior tibial artery. LEFT Lower Extremity Inflow: As described above, there is occlusion of the left common iliac artery. There is reconstitution of the left external and internal iliac arteries. Minimal narrowing is suggested along the left common femoral artery. Outflow: The profunda femoris artery appears intact. The superficial femoral artery is only minimally opacified, reflecting more proximal collision. Underlying narrowing cannot be excluded. The popliteal artery is not well characterized, though grossly intact. Runoff: Runoff to the left ankle is not well characterized due to more proximal occlusion. There is suggestion of two-vessel runoff to the level of the left calf. Veins: The visualized venous structures are grossly unremarkable, though difficult to fully assess given the phase of contrast enhancement. Review of the MIP images confirms the above findings. NON-VASCULAR Lower chest: Small mildly loculated bilateral pleural effusions are noted. Underlying atelectasis is noted. There is marked enlargement of the right side of the heart, particularly at the right atrium, concerning for right heart insufficiency. There is reflux of contrast into the hepatic veins and IVC. Hepatobiliary: A 2.4 cm hypodensity is noted at the medial right hepatic lobe. The liver is otherwise grossly unremarkable. Small stones are seen within the gallbladder. The gallbladder is otherwise unremarkable. The common bile duct remains normal in caliber. Pancreas: The pancreas is within normal limits. Spleen: The spleen is unremarkable in appearance. Adrenals/Urinary Tract: The adrenal glands are unremarkable in appearance. Right renal cysts are  noted. There is no evidence of hydronephrosis. No renal or ureteral stones are identified. No significant perinephric stranding is seen. Stomach/Bowel: The stomach is unremarkable in appearance. The small bowel is within normal limits. The appendix is not visualized; there is no evidence for appendicitis. Diffuse diverticulosis is noted along the sigmoid colon, without evidence of diverticulitis. Lymphatic: No retroperitoneal or pelvic sidewall lymphadenopathy is seen. Reproductive: Mild soft tissue inflammation about  the bladder could reflect cystitis. The patient is status post hysterectomy. No suspicious adnexal masses are seen. Other: Prominent varices are seen tracking about the vagina and within the perineal soft tissues, extending to the labia. Musculoskeletal: No acute osseous abnormalities are identified. Chronic compression deformities are seen at L1 and L3. The visualized musculature is unremarkable in appearance. Diffuse soft tissue edema is seen along the lower legs and feet bilaterally. No knee joint effusions are seen. There is mild atrophy of the distal thigh musculature bilaterally. IMPRESSION: VASCULAR 1. Acute occlusion of the distal abdominal aorta and common iliac arteries, from just below the level of the origin of the inferior mesenteric artery, to the bifurcations of the common iliac arteries bilaterally. There is reconstitution of the external and internal iliac arteries bilaterally, likely secondary to collateral vessels along the abdominal wall and within the peritoneum. 2. Occlusion of one of the branches of the superior mesenteric artery, though it reconstitutes distally. Vague surrounding mesenteric edema seen. No definite evidence for ischemic bowel at this time. This is likely relatively acute in nature. 3. Runoff to the lower extremities is not well assessed due to more proximal occlusion. There is perhaps 1-vessel runoff to the level of the right calf, and two-vessel runoff to the  level of the left calf. 4. Minimal narrowing suggested along the common femoral arteries bilaterally. The superficial femoral arteries are difficult to fully assess. 5. Slight narrowing of the celiac trunk. 6. Scattered aortic atherosclerosis. 7. Prominent varices noted tracking about the vagina and within the perineal soft tissues, extending to the labia. NON-VASCULAR 1. Marked enlargement of the right side of the heart, particularly at the right atrium, concerning for right heart insufficiency. Reflux of contrast into the hepatic veins and IVC. 2. Mild soft tissue inflammation about the bladder could reflect cystitis. 3. Small mildly loculated bilateral pleural effusions, with underlying atelectasis. 4. Diffuse soft tissue edema along the lower legs and feet bilaterally. 5. Cholelithiasis.  Gallbladder otherwise unremarkable. 6. 2.4 cm nonspecific hypodensity at the medial right hepatic lobe. 7. Right renal cysts noted. 8. Diffuse diverticulosis along the sigmoid colon, without evidence of diverticulitis. 9. Chronic compression deformities at L1 and L3. 10. Mild atrophy of the distal thigh musculature bilaterally. Critical Value/emergent results were called by telephone at the time of interpretation on 02/12/2017 at 8:57 pm to Dr. Conni Slipper, who verbally acknowledged these results. Electronically Signed   By: Garald Balding M.D.   On: 02/21/2017 21:32   Dg Chest Port 1 View  Result Date: 02/20/2017 CLINICAL DATA:  Cough. EXAM: PORTABLE CHEST 1 VIEW COMPARISON:  Chest radiograph 07/05/2015, lung bases from abdominal CT 01/27/2017 FINDINGS: Moderate cardiomegaly. Atherosclerosis of the thoracic aorta. Bilateral pleural effusions, larger on the right which is likely partially loculated. No pulmonary edema. Probable compressive atelectasis in the lower lobes. No pneumothorax. Bones are under mineralized. IMPRESSION: Cardiomegaly with moderate bilateral pleural effusions. Right pleural effusion is likely  partially loculated. Thoracic aortic atherosclerosis. Electronically Signed   By: Jeb Levering M.D.   On: 02/20/2017 00:31     CBC  Recent Labs Lab 02/15/2017 1937 02/24/2017 0534 02/20/17 0358 02/21/17 0841  WBC 6.4 7.1 20.9* 18.3*  HGB 14.6 13.9 12.2 11.1*  HCT 43.3 41.5 37.6 32.8*  PLT 180 148* 149* 124*  MCV 100.9* 102.4* 103.2* 100.5*  MCH 34.0 34.2* 33.5 34.0  MCHC 33.7 33.4 32.4 33.8  RDW 16.9* 17.2* 17.7* 17.0*  LYMPHSABS 1.0  --   --   --  MONOABS 0.6  --   --   --   EOSABS 0.0  --   --   --   BASOSABS 0.1  --   --   --     Chemistries   Recent Labs Lab 02/07/2017 1937 02/02/2017 0534 01/29/2017 2257 02/20/17 1315 02/21/17 0841  NA 142 144 146* 146* 145  K 3.2* 3.6 3.7 3.2* 3.0*  CL 105 108 111 109 104  CO2 27 27 20* 22 29  GLUCOSE 147* 115* 154* 167* 92  BUN 24* 21* 24* 28* 34*  CREATININE 1.25* 1.04* 1.35* 1.47* 1.65*  CALCIUM 8.4* 8.1* 7.8* 7.5* 6.9*  AST  --   --   --  82*  --   ALT  --   --   --  22  --   ALKPHOS  --   --   --  60  --   BILITOT  --   --   --  1.7*  --    ------------------------------------------------------------------------------------------------------------------ estimated creatinine clearance is 22 mL/min (A) (by C-G formula based on SCr of 1.65 mg/dL (H)). ------------------------------------------------------------------------------------------------------------------ No results for input(s): HGBA1C in the last 72 hours. ------------------------------------------------------------------------------------------------------------------ No results for input(s): CHOL, HDL, LDLCALC, TRIG, CHOLHDL, LDLDIRECT in the last 72 hours. ------------------------------------------------------------------------------------------------------------------ No results for input(s): TSH, T4TOTAL, T3FREE, THYROIDAB in the last 72 hours.  Invalid input(s):  FREET3 ------------------------------------------------------------------------------------------------------------------ No results for input(s): VITAMINB12, FOLATE, FERRITIN, TIBC, IRON, RETICCTPCT in the last 72 hours.  Coagulation profile  Recent Labs Lab 02/24/2017 1937  INR 1.14    No results for input(s): DDIMER in the last 72 hours.  Cardiac Enzymes No results for input(s): CKMB, TROPONINI, MYOGLOBIN in the last 168 hours.  Invalid input(s): CK ------------------------------------------------------------------------------------------------------------------ Invalid input(s): [place]  Patient is 83 year old presented with bilateral lower extremity ischemia and aortoiliac occlusion 1. Aortic occlusion (HCC) -  s/p thrombolytic therapy continue IV heparin 2. Abdominal pain due to pan colitis due to ischemia  3   oliguric renal failure due to ATN still not making much urine 4. HLD (hyperlipidemia) - continue  Mevacor if able to take 5.  Hypothyroidism - continue Syntroid 6.  GERD (gastroesophageal reflux disease) - home dose PPI 7. Code status: DNR, again I discussed the situation with the son told him prognosis is very poor and patient is critical he will be coming to the hospital we will meet with palliative care     Code Status Orders        Start     Ordered   02/10/2017 2358  Do not attempt resuscitation (DNR)  Continuous    Question Answer Comment  In the event of cardiac or respiratory ARREST Do not call a "code blue"   In the event of cardiac or respiratory ARREST Do not perform Intubation, CPR, defibrillation or ACLS   In the event of cardiac or respiratory ARREST Use medication by any route, position, wound care, and other measures to relive pain and suffering. May use oxygen, suction and manual treatment of airway obstruction as needed for comfort.      02/11/2017 2357    Code Status History    Date Active Date Inactive Code Status Order  ID Comments User Context   This patient has a current code status but no historical code status.           Consults  vascular  DVT Prophylaxis  heparin  Lab Results  Component Value Date   PLT 124 (  L) 02/21/2017     Time Spent in minutes   35 min critical care pt now with ischemic gut and ARF crtically ILL Dustin Flock M.D on 02/21/2017 at 4:08 PM  Between 7am to 6pm - Pager - [phone]  After 6pm go to [email or web address] - password [email or web address] [facility]   Office  [phone]

## 2017-02-25 NOTE — Progress Notes (Signed)
Patient became unresponsive and was pronounced dead at 0204 this morning.  Dr. Ara Kussmaul pronounced death and next of kin was notified.  Patient sent to morgue.  Faith Donovan  2017/03/04  2:43 AM

## 2017-02-25 NOTE — Death Summary Note (Signed)
Patient ID: Faith Donovan, female   DOB: [birth year], 83 y.o.   MRN: [id number]     [place] at [facility]    Death Note     Death Note please see Last Note for all details.   In brief: Called by nursing to pronounce the death of Ms. July.  On my arrival patient was found to be unresponsive to verbal, tactile or painful stimuli.  Corneal and gag reflexes were absent.  There was no palpable pulse, no heart tones and no breath sounds.  Time of death 0204.  Nurse present during pronouncement to inform family.     Faith Donovan [id number] is a 83 y.o. female, Outpatient Primary MD for the patient is Marguerita Merles, MD  Pronounced dead by Harvie Bridge, D.O. On 03/17/17     @   1102  Harvie Bridge M.D on 03-17-2017 at 2:11 AM  [place] at [place] [phone]  Total clinical and documentation time for today Under 30 minutes   Last Note

## 2017-02-25 DEATH — deceased

## 2017-02-27 ENCOUNTER — Other Ambulatory Visit (INDEPENDENT_AMBULATORY_CARE_PROVIDER_SITE_OTHER): Payer: Self-pay | Admitting: Vascular Surgery

## 2018-08-06 IMAGING — CT CT ANGIO AOBIFEM WO/W CM
2 of 10 series · 10 of 46 positions shown, 12 images · IV contrast (APPLIED)
Comparison: CT of the abdomen and pelvis performed 07/05/2015

CLINICAL DATA: Loss of bilateral pedal pulses, with cold right foot
and tibia, and pale skin at the left lower extremity. Initial
encounter.

EXAM:
CT ANGIOGRAPHY OF ABDOMINAL AORTA WITH ILIOFEMORAL RUNOFF
TECHNIQUE: Multidetector CT imaging of the abdomen, pelvis and lower
extremities was performed using the standard protocol during bolus
administration of intravenous contrast. Multiplanar CT image
reconstructions and MIPs were obtained to evaluate the vascular
anatomy.
CONTRAST:  100 mL of Isovue 370 IV contrast

[Series 4: axial arterial upper · axial · arterial · 0.73mm/px · z∈[+438,+1500]mm · 9 of 438 slices shown, 11 images]
[im 42/438  soft-tissue]
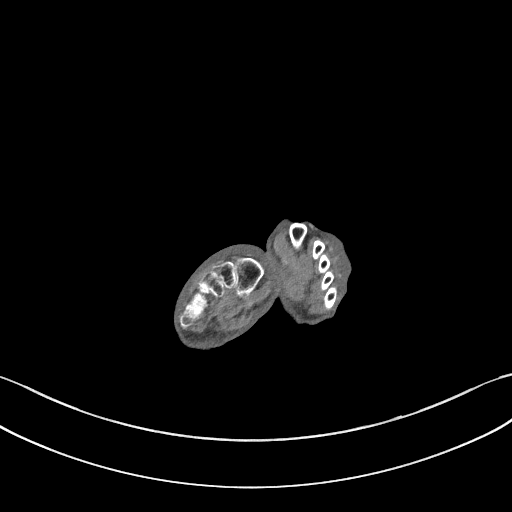
[im 42/438  bone]
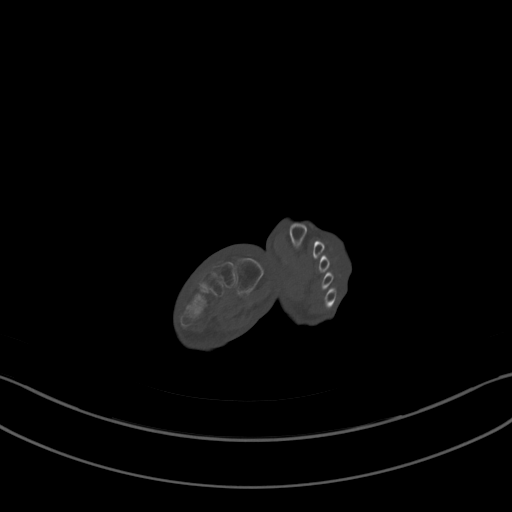
[im 84/438  soft-tissue]
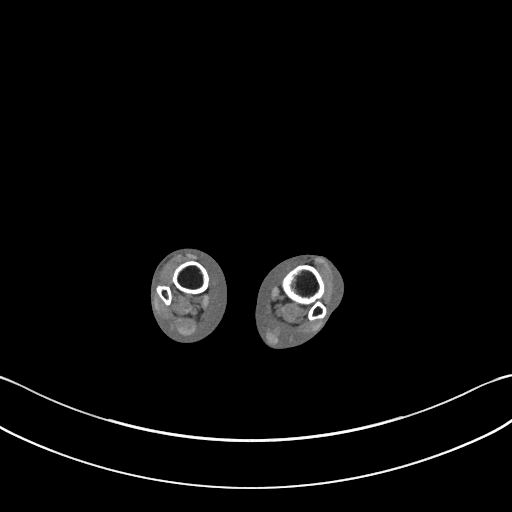
[im 125/438  soft-tissue]
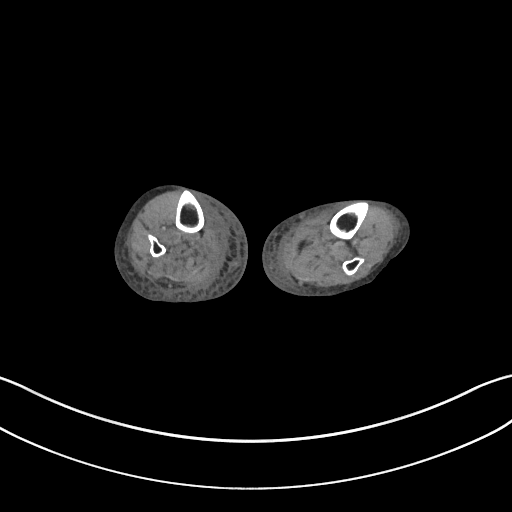
[im 167/438  soft-tissue]
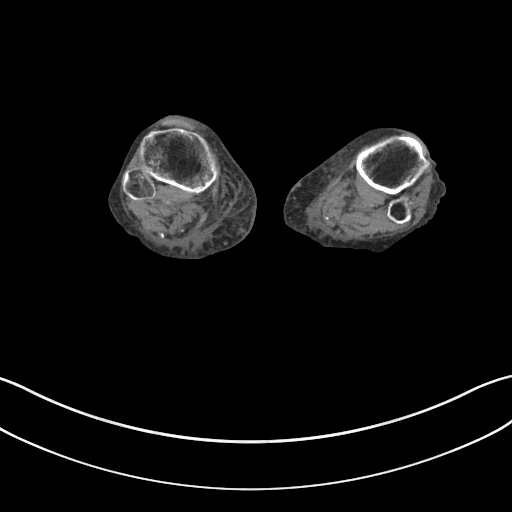
[im 229/438  soft-tissue]
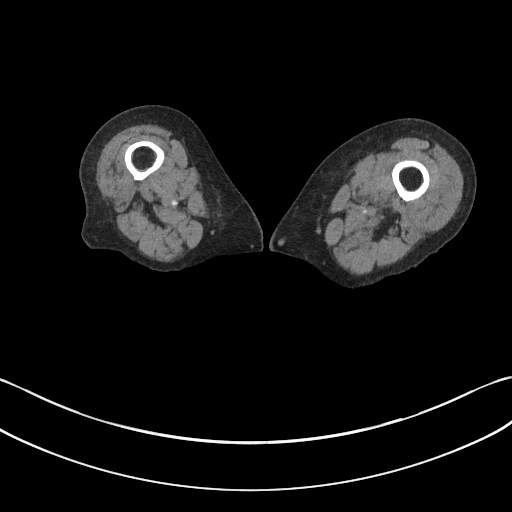
[im 271/438  soft-tissue]
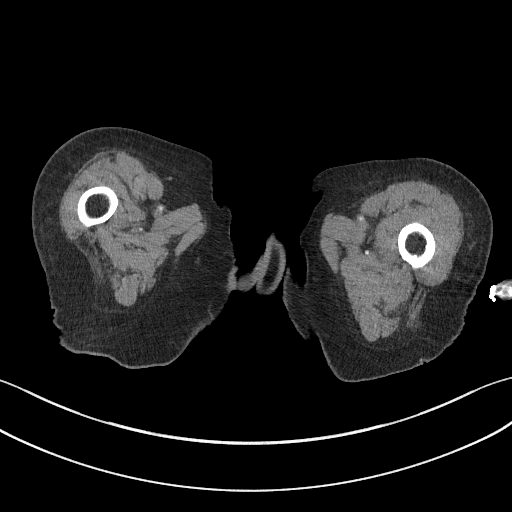
[im 313/438  soft-tissue]
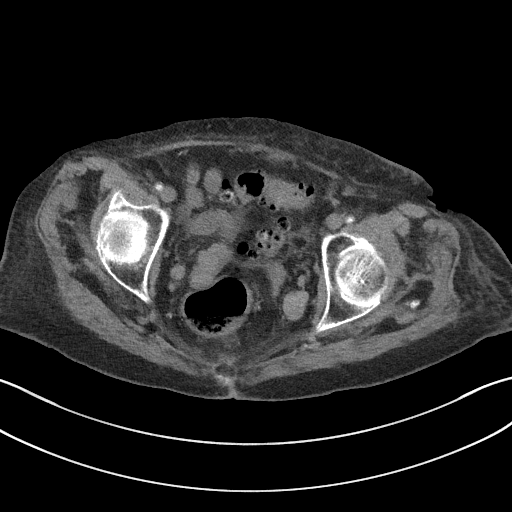
[im 354/438  soft-tissue]
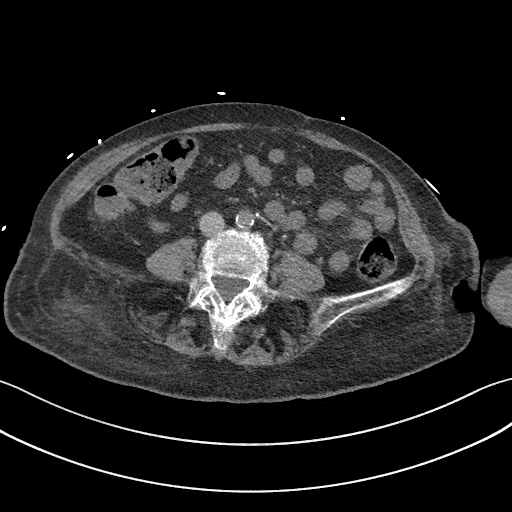
[im 396/438  soft-tissue]
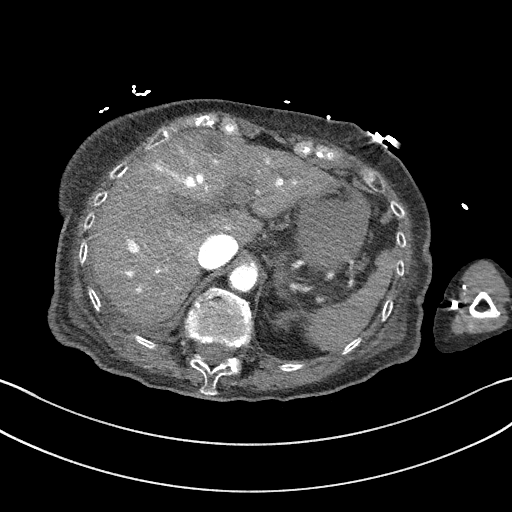
[im 396/438  bone]
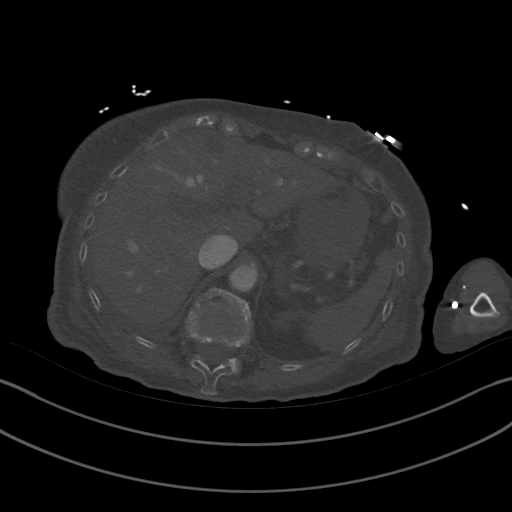

[Series 5: coronal upper · coronal · 0.75mm/px · 1 of 142 slices shown]
[im 71/142  soft-tissue]
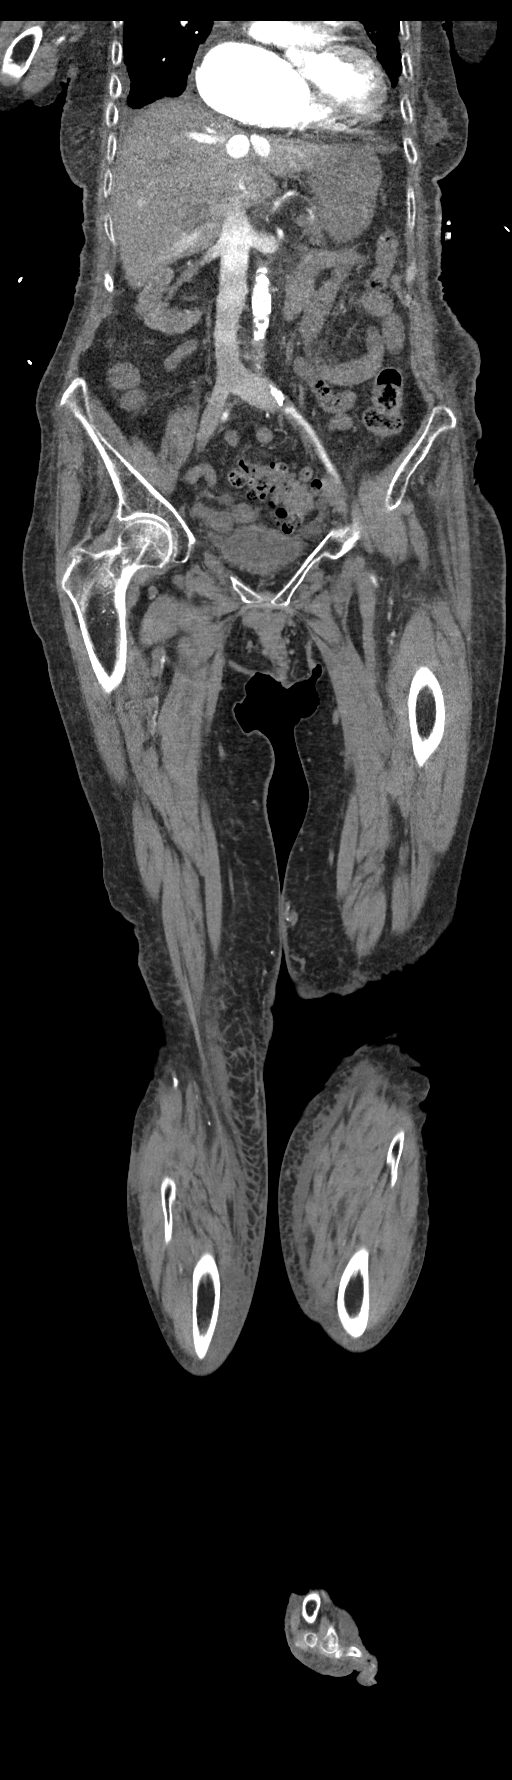

[10 of 46 positions shown; findings below may reference images not displayed]

FINDINGS: VASCULAR

Aorta: There is acute occlusion of the distal abdominal aorta just
below the origin of the mesenteric artery, extending to the
bifurcations of the common iliac arteries bilaterally. The external
and internal iliac arteries demonstrate reconstitution, likely
secondary to collateral vessels along the abdominal wall and within
the peritoneum.

Scattered calcification is seen along the abdominal aorta and its
branches.

Celiac: There is slight narrowing along the celiac trunk.

SMA: There appears to be occlusion of one of the branches of the
superior mesenteric artery, though it reconstitutes distally. Vague
surrounding mesenteric edema is seen. No definite ischemic bowel is
seen at this time.

Renals: The renal arteries are diminutive but grossly patent
bilaterally.

IMA: The inferior mesenteric artery remains patent.

RIGHT Lower Extremity

Inflow: As described above, there is occlusion of the right common
iliac artery. There is reconstitution of the right external and
internal iliac arteries. Minimal narrowing is suggested along the
right common femoral artery.

Outflow: The profunda femoris artery appears patent. The superficial
femoral artery is only minimally opacified, reflecting more proximal
occlusion. Underlying narrowing cannot be excluded. The popliteal
artery is grossly intact, though difficult to characterize.

Runoff: Runoff to the right ankle is not well characterized due to
more proximal occlusion. Flow is noted within the anterior tibial
artery.

LEFT Lower Extremity

Inflow: As described above, there is occlusion of the left common
iliac artery. There is reconstitution of the left external and
internal iliac arteries. Minimal narrowing is suggested along the
left common femoral artery.

Outflow: The profunda femoris artery appears intact. The superficial
femoral artery is only minimally opacified, reflecting more proximal
collision. Underlying narrowing cannot be excluded. The popliteal
artery is not well characterized, though grossly intact.

Runoff: Runoff to the left ankle is not well characterized due to
more proximal occlusion. There is suggestion of two-vessel runoff to
the level of the left calf.

Veins: The visualized venous structures are grossly unremarkable,
though difficult to fully assess given the phase of contrast
enhancement.

Review of the MIP images confirms the above findings.

NON-VASCULAR

Lower chest: Small mildly loculated bilateral pleural effusions are
noted. Underlying atelectasis is noted. There is marked enlargement
of the right side of the heart, particularly at the right atrium,
concerning for right heart insufficiency. There is reflux of
contrast into the hepatic veins and IVC.

Hepatobiliary: A 2.4 cm hypodensity is noted at the medial right
hepatic lobe. The liver is otherwise grossly unremarkable. Small
stones are seen within the gallbladder. The gallbladder is otherwise
unremarkable. The common bile duct remains normal in caliber.

Pancreas: The pancreas is within normal limits.

Spleen: The spleen is unremarkable in appearance.

Adrenals/Urinary Tract: The adrenal glands are unremarkable in
appearance.

Right renal cysts are noted. There is no evidence of hydronephrosis.
No renal or ureteral stones are identified. No significant
perinephric stranding is seen.

Stomach/Bowel: The stomach is unremarkable in appearance. The small
bowel is within normal limits. The appendix is not visualized; there
is no evidence for appendicitis.

Diffuse diverticulosis is noted along the sigmoid colon, without
evidence of diverticulitis.

Lymphatic: No retroperitoneal or pelvic sidewall lymphadenopathy is
seen.

Reproductive: Mild soft tissue inflammation about the bladder could
reflect cystitis. The patient is status post hysterectomy. No
suspicious adnexal masses are seen.

Other: Prominent varices are seen tracking about the vagina and
within the perineal soft tissues, extending to the labia.

Musculoskeletal: No acute osseous abnormalities are identified.
Chronic compression deformities are seen at L1 and L3. The
visualized musculature is unremarkable in appearance.

Diffuse soft tissue edema is seen along the lower legs and feet
bilaterally. No knee joint effusions are seen. There is mild atrophy
of the distal thigh musculature bilaterally.
IMPRESSION: VASCULAR

1. Acute occlusion of the distal abdominal aorta and common iliac
arteries, from just below the level of the origin of the inferior
mesenteric artery, to the bifurcations of the common iliac arteries
bilaterally. There is reconstitution of the external and internal
iliac arteries bilaterally, likely secondary to collateral vessels
along the abdominal wall and within the peritoneum.
2. Occlusion of one of the branches of the superior mesenteric
artery, though it reconstitutes distally. Vague surrounding
mesenteric edema seen. No definite evidence for ischemic bowel at
this time. This is likely relatively acute in nature.
3. Runoff to the lower extremities is not well assessed due to more
proximal occlusion. There is perhaps 1-vessel runoff to the level of
the right calf, and two-vessel runoff to the level of the left calf.
4. Minimal narrowing suggested along the common femoral arteries
bilaterally. The superficial femoral arteries are difficult to fully
assess.
5. Slight narrowing of the celiac trunk.
6. Scattered aortic atherosclerosis.
7. Prominent varices noted tracking about the vagina and within the
perineal soft tissues, extending to the labia.

NON-VASCULAR

1. Marked enlargement of the right side of the heart, particularly
at the right atrium, concerning for right heart insufficiency.
Reflux of contrast into the hepatic veins and IVC.
2. Mild soft tissue inflammation about the bladder could reflect
cystitis.
3. Small mildly loculated bilateral pleural effusions, with
underlying atelectasis.
4. Diffuse soft tissue edema along the lower legs and feet
bilaterally.
5. Cholelithiasis.  Gallbladder otherwise unremarkable.
6. 2.4 cm nonspecific hypodensity at the medial right hepatic lobe.
7. Right renal cysts noted.
8. Diffuse diverticulosis along the sigmoid colon, without evidence
of diverticulitis.
9. Chronic compression deformities at L1 and L3.
10. Mild atrophy of the distal thigh musculature bilaterally.

Critical Value/emergent results were called by telephone at the time
of interpretation on 02/18/2017 at [DATE] to Dr. Wedmagiho Kuyyu, who
verbally acknowledged these results.

## 2018-08-07 IMAGING — DX DG CHEST 1V PORT
1 series · 1 of 1 positions shown · non-contrast
Comparison: Chest radiograph 07/05/2015, lung bases from abdominal
CT 02/18/2017

CLINICAL DATA: Cough.

EXAM:
PORTABLE CHEST 1 VIEW

[chest ap]
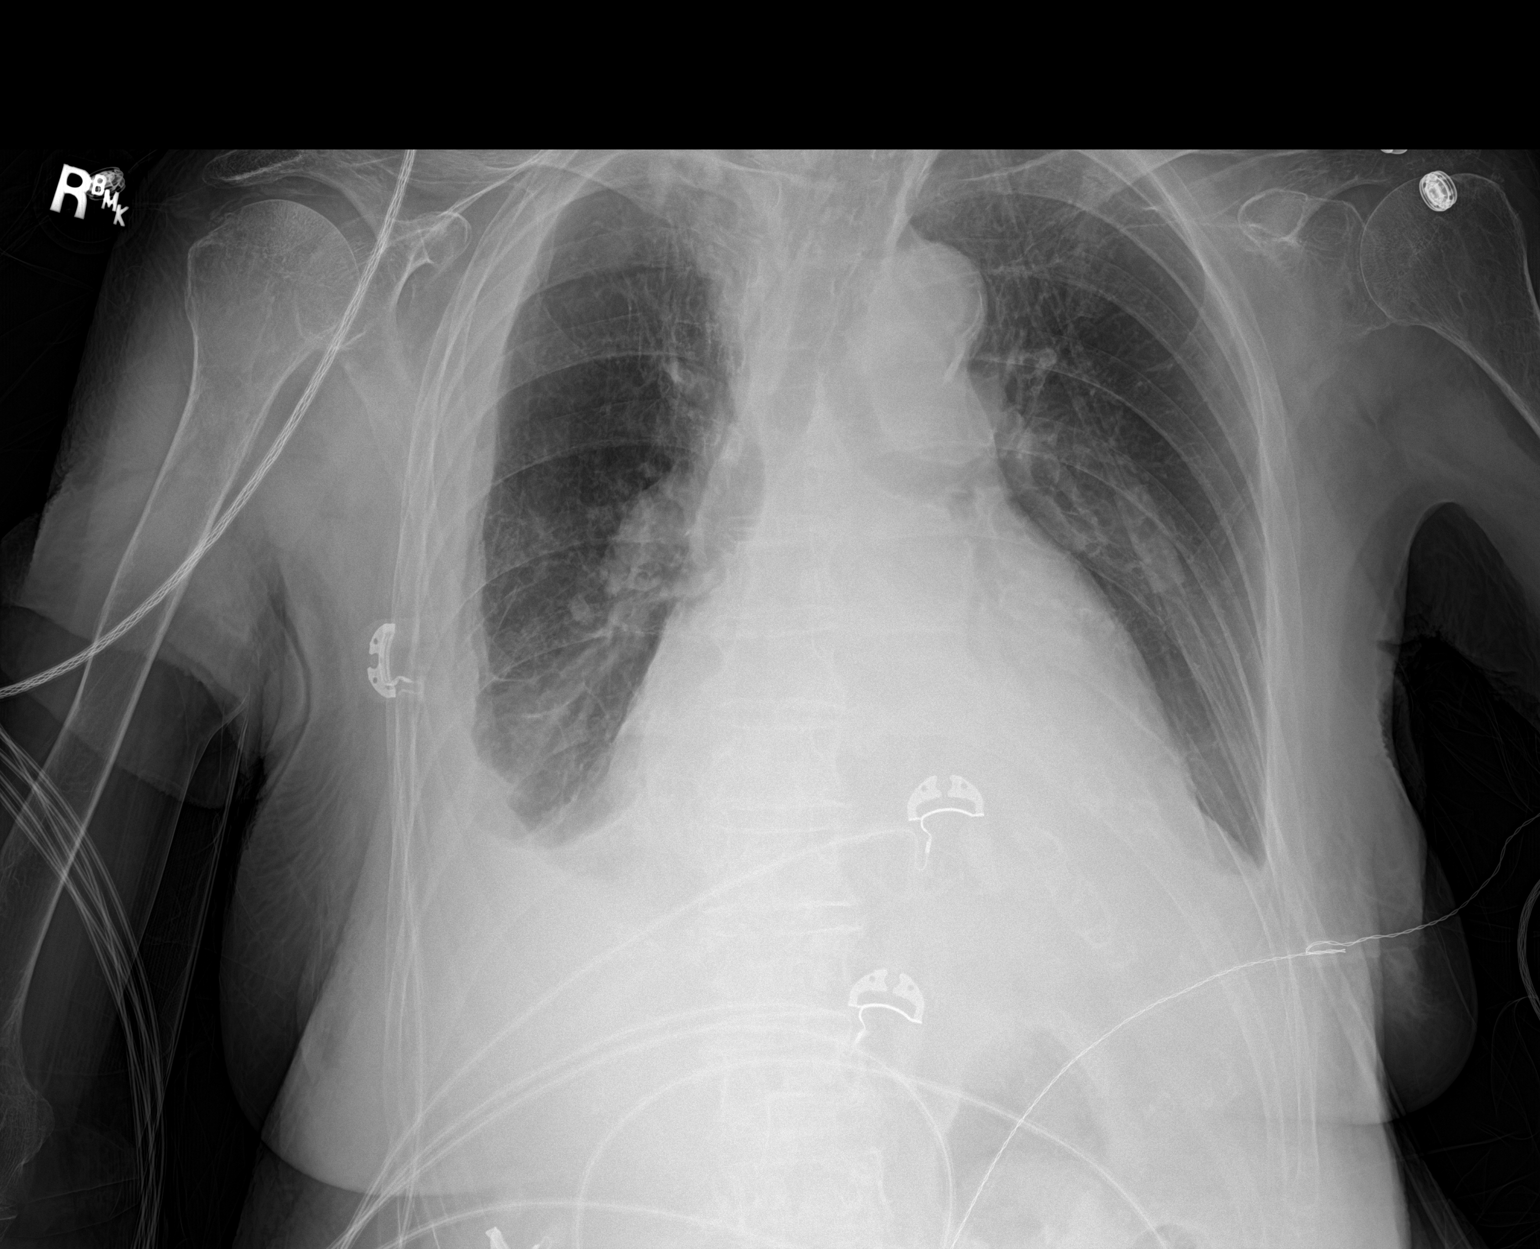

[1 of 1 positions shown; findings below may reference images not displayed]

FINDINGS: Moderate cardiomegaly. Atherosclerosis of the thoracic aorta.
Bilateral pleural effusions, larger on the right which is likely
partially loculated. No pulmonary edema. Probable compressive
atelectasis in the lower lobes. No pneumothorax. Bones are under
mineralized.
IMPRESSION: Cardiomegaly with moderate bilateral pleural effusions. Right
pleural effusion is likely partially loculated.

Thoracic aortic atherosclerosis.

## 2018-08-08 IMAGING — CT CT ABD-PELV W/O CM
2 of 4 series · 16 of 46 positions shown, 18 images · non-contrast
Comparison: CTA 02/18/2017

CLINICAL DATA: Lower extremity pain and numbness. Distal aortic
occlusion.

EXAM:
CT ABDOMEN AND PELVIS WITHOUT CONTRAST
TECHNIQUE: Multidetector CT imaging of the abdomen and pelvis was performed
following the standard protocol without IV contrast.

[Series 2: routine abd/pel wo · axial · 0.70mm/px · z∈[-1320,-900]mm · 13 of 92 slices shown, 15 images]
[im 4/92  soft-tissue]
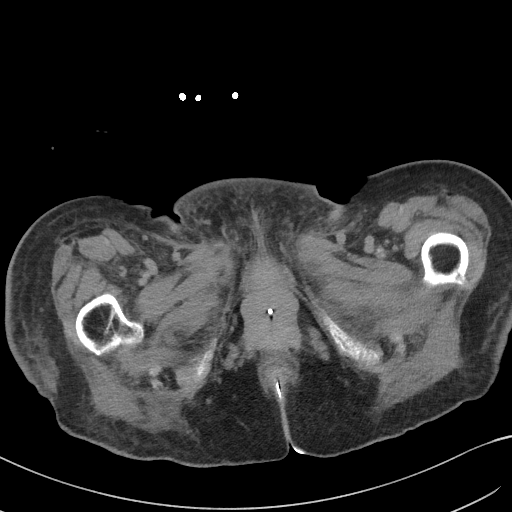
[im 4/92  bone]
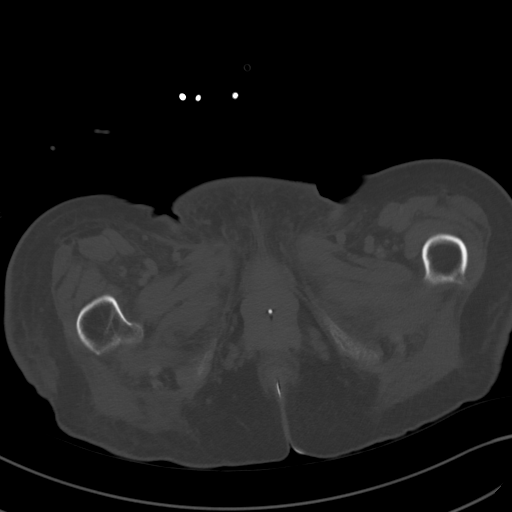
[im 12/92  soft-tissue]
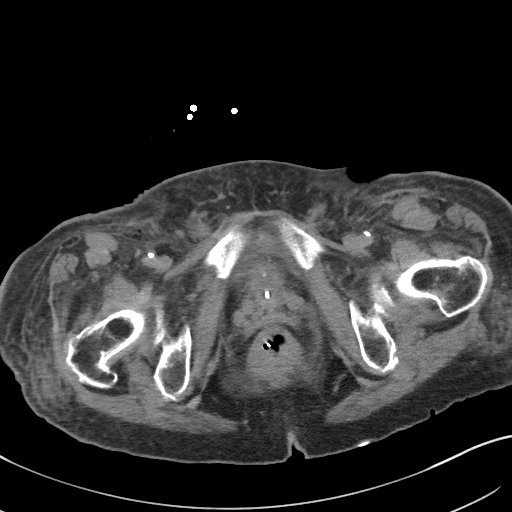
[im 19/92  soft-tissue]
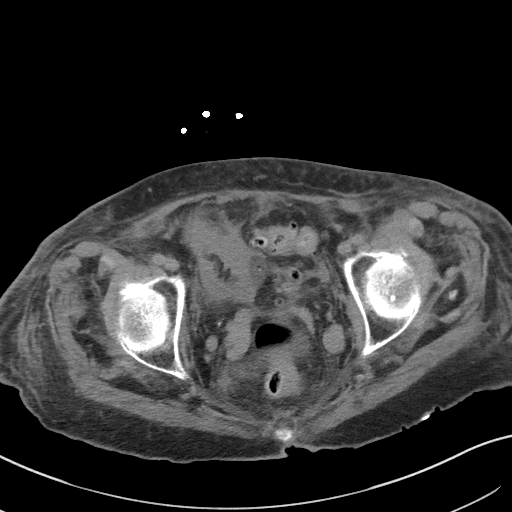
[im 27/92  soft-tissue]
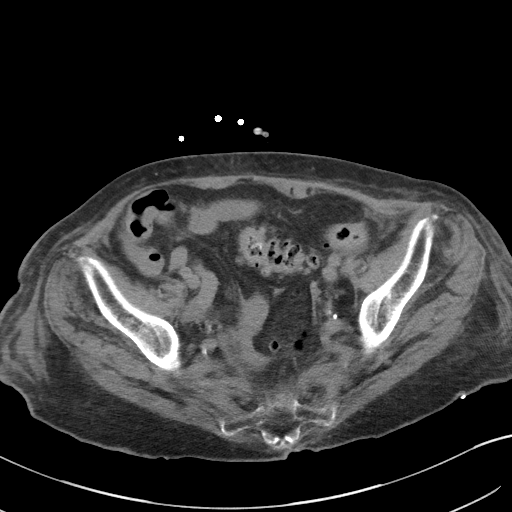
[im 31/92  soft-tissue]
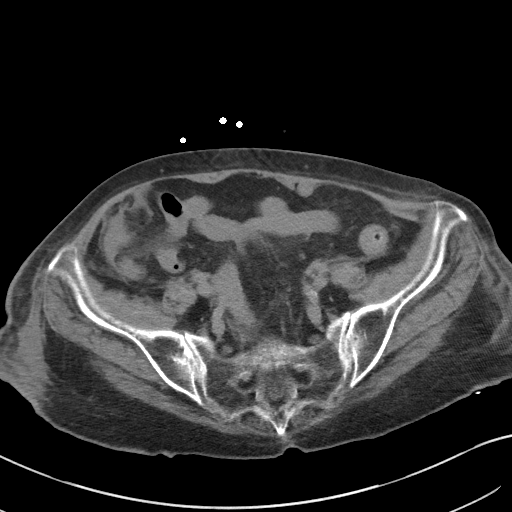
[im 38/92  soft-tissue]
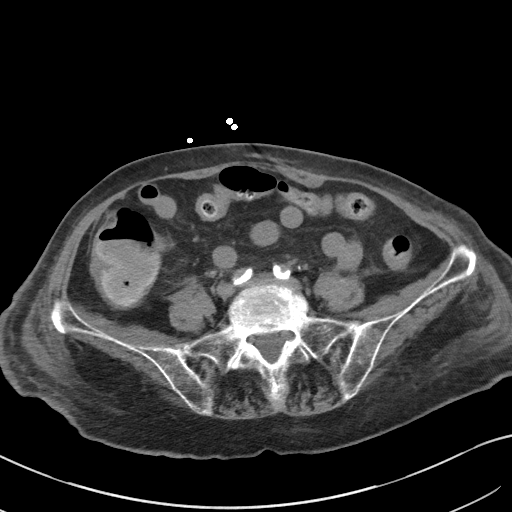
[im 46/92  soft-tissue]
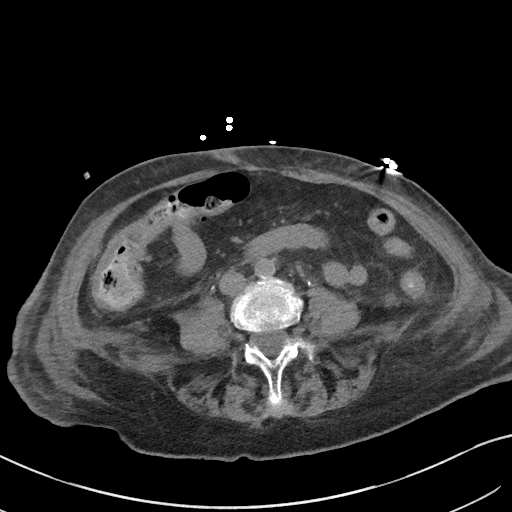
[im 54/92  soft-tissue]
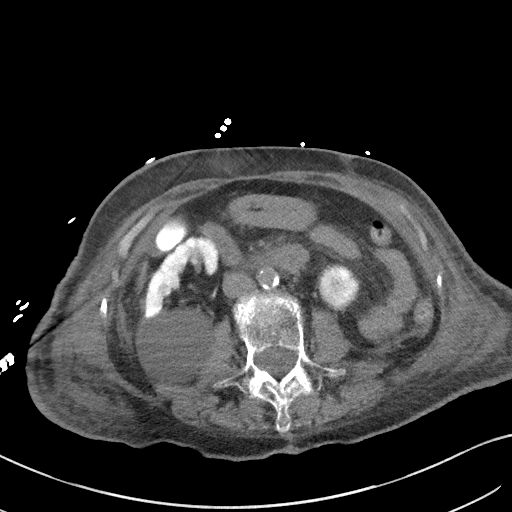
[im 61/92  soft-tissue]
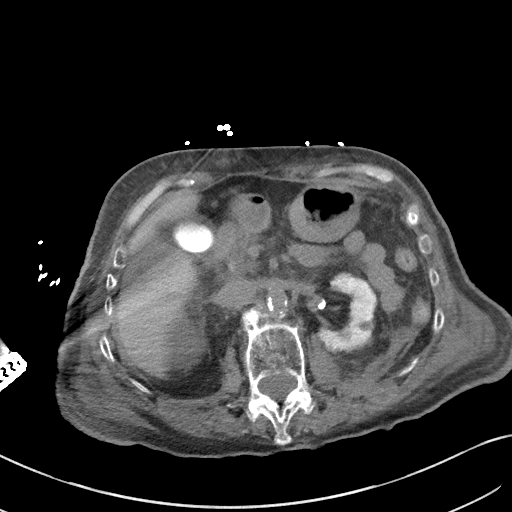
[im 61/92  bone]
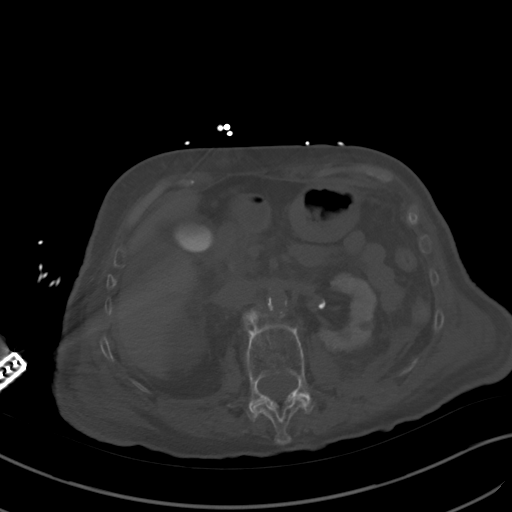
[im 65/92  soft-tissue]
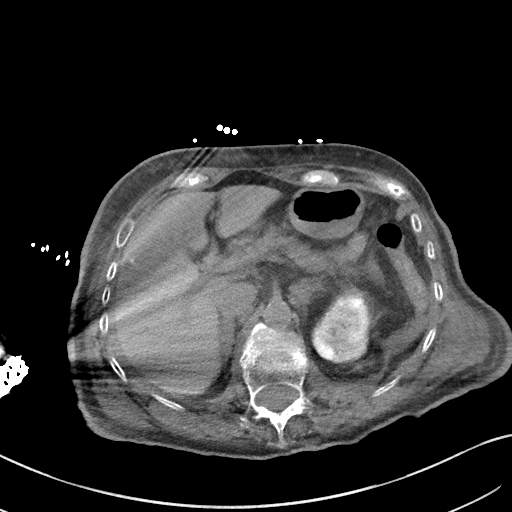
[im 73/92  soft-tissue]
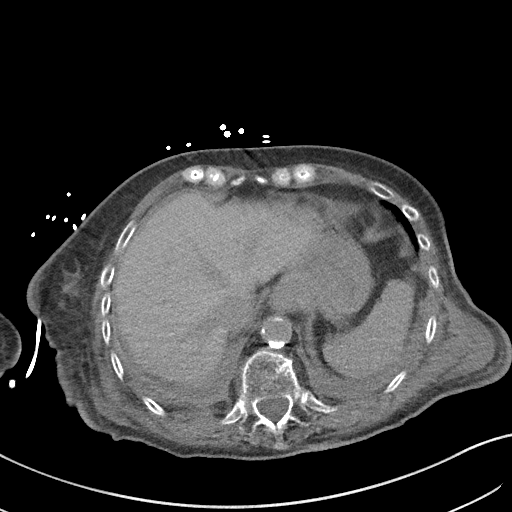
[im 80/92  soft-tissue]
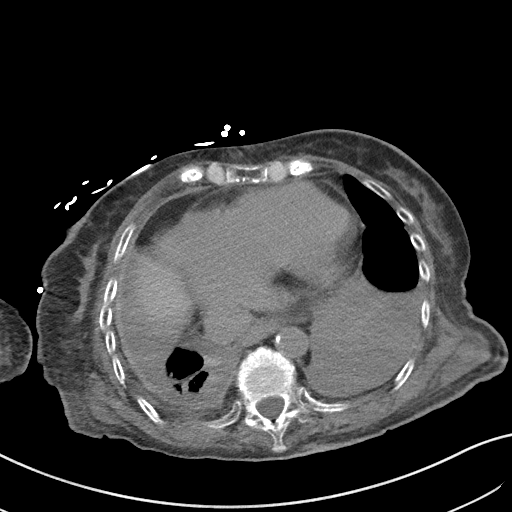
[im 88/92  soft-tissue]
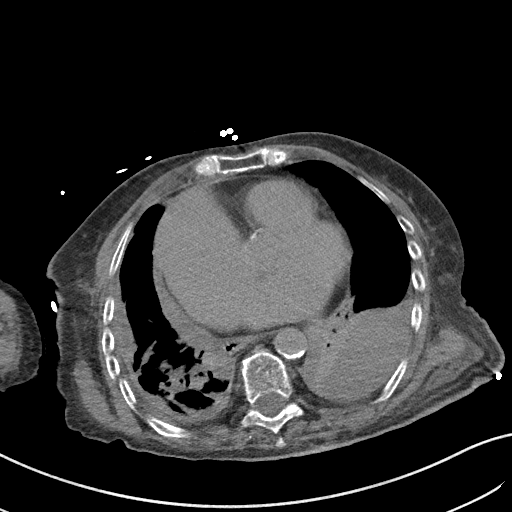

[Series 5: coronal st · coronal · 0.80mm/px · 3 of 100 slices shown]
[im 34/100  soft-tissue]
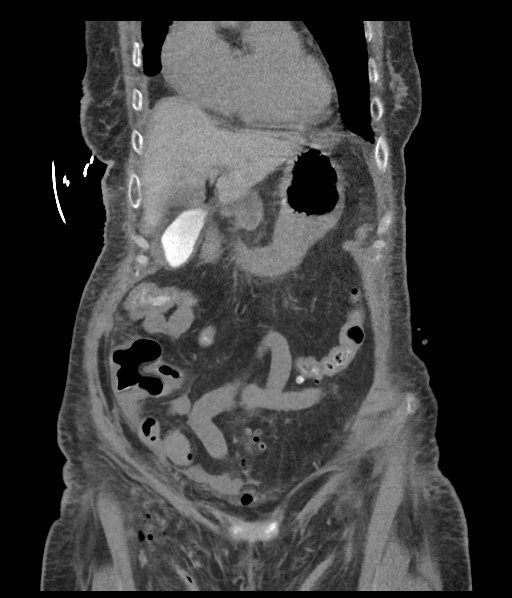
[im 45/100  soft-tissue]
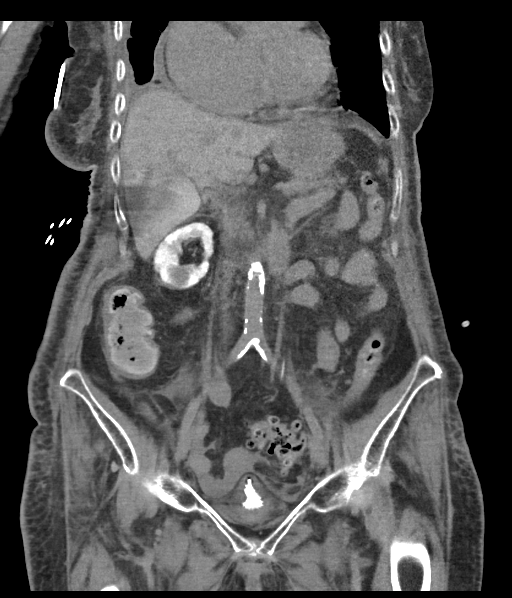
[im 56/100  soft-tissue]
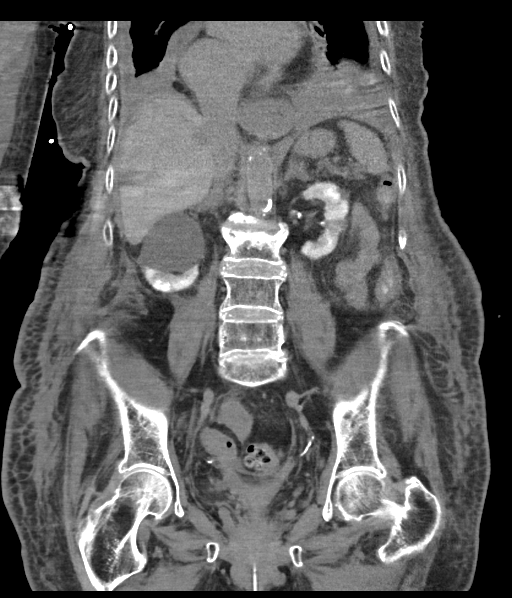

[16 of 46 positions shown; findings below may reference images not displayed]

FINDINGS: Lower chest: Mild increase in LEFT pleural effusion. Bibasilar
atelectasis.

Hepatobiliary: Non IV contrast imaging demonstrates new new hepatic
lesion. Low-density lesion in the LEFT hepatic lobe has simple fluid
attenuation. Small lesion in the RIGHT hepatic lobe (image 27,
series 2 likely represent partially calcified hemangioma. Vicarious
excretion of contrast into the gallbladder.

Pancreas: Pancreas is normal. No ductal dilatation. No pancreatic
inflammation.

Spleen: Normal spleen

Adrenals/urinary tract: There is retention of IV contrast from CT
exam 2 days prior the consistent with hypotension renal
insufficiency bilateral renal cysts. Ureters normal. Contrast within
the bladder. Bladder collapse from Foley catheter.

Stomach/Bowel: Stomach, small-bowel cecum normal. There is mild
bowel wall edema associated the ascending and proximal transverse
colon. The bowel wall thickening of the descending colon sigmoid
colon and rectum. The milder in the rectum diverticulosis of the
sigmoid.

Moderate amount free fluid along the ascending colon and in the
pelvis.

No evidence pneumatosis portal venous gas.

Vascular/Lymphatic: Heavy intimal calcification abdominal aorta. No
aneurysm

Reproductive: Post hysterectomy

Other: Moderate free fluid

Musculoskeletal: No aggressive osseous lesion.
IMPRESSION: 1. Edema associated with the ascending transverse colon and to
lesser degree the descending and sigmoid colon consistent with
pancolitis. Consider ischemic colitis with the abdominal aortic
obstruction seen on comparison CTA exam. No pneumatosis or portal
venous gas.
2. Increase in intraperitoneal free fluid along the pericolic
gutters within the pelvis may relate to bowel pathology or renal
insufficiency
3. Retention of IV contrast the kidneys consistent with with severe
renal insufficiency and hypertension
4. Increase in LEFT pleural effusion. Bibasilar atelectasis
additionally.
These results will be called to the ordering clinician or
representative by the Radiologist Assistant, and communication
documented in the PACS or zVision Dashboard.
